# Patient Record
Sex: Male | Born: 1965 | Race: Black or African American | Hispanic: No | Marital: Single | Smoking: Current every day smoker
Health system: Southern US, Community
[De-identification: ages and names within clinical notes are randomized; demographics above are authoritative.]

## PROBLEM LIST (undated history)

## (undated) DIAGNOSIS — F209 Schizophrenia, unspecified: Secondary | ICD-10-CM

## (undated) DIAGNOSIS — E119 Type 2 diabetes mellitus without complications: Secondary | ICD-10-CM

## (undated) DIAGNOSIS — F419 Anxiety disorder, unspecified: Secondary | ICD-10-CM

## (undated) DIAGNOSIS — F319 Bipolar disorder, unspecified: Secondary | ICD-10-CM

## (undated) DIAGNOSIS — K219 Gastro-esophageal reflux disease without esophagitis: Secondary | ICD-10-CM

## (undated) DIAGNOSIS — E7800 Pure hypercholesterolemia, unspecified: Secondary | ICD-10-CM

## (undated) HISTORY — DX: Gastro-esophageal reflux disease without esophagitis: K21.9

## (undated) HISTORY — DX: Bipolar disorder, unspecified: F31.9

## (undated) HISTORY — PX: LACERATION REPAIR: SHX5168

## (undated) HISTORY — DX: Schizophrenia, unspecified: F20.9

## (undated) HISTORY — DX: Anxiety disorder, unspecified: F41.9

## (undated) HISTORY — DX: Type 2 diabetes mellitus without complications: E11.9

---

## 2017-07-29 DIAGNOSIS — F209 Schizophrenia, unspecified: Secondary | ICD-10-CM | POA: Insufficient documentation

## 2017-07-29 DIAGNOSIS — F71 Moderate intellectual disabilities: Secondary | ICD-10-CM | POA: Insufficient documentation

## 2017-10-01 ENCOUNTER — Ambulatory Visit: Payer: Self-pay | Admitting: Family Medicine

## 2017-10-02 ENCOUNTER — Other Ambulatory Visit (HOSPITAL_COMMUNITY)
Admission: RE | Admit: 2017-10-02 | Discharge: 2017-10-02 | Disposition: A | Discharge status: Home / Self Care | Payer: Medicare Other | Source: Ambulatory Visit | Attending: Family Medicine | Admitting: Family Medicine

## 2017-10-02 ENCOUNTER — Other Ambulatory Visit: Payer: Self-pay

## 2017-10-02 ENCOUNTER — Encounter (INDEPENDENT_AMBULATORY_CARE_PROVIDER_SITE_OTHER): Payer: Self-pay

## 2017-10-02 ENCOUNTER — Encounter: Payer: Self-pay | Admitting: Family Medicine

## 2017-10-02 ENCOUNTER — Ambulatory Visit (INDEPENDENT_AMBULATORY_CARE_PROVIDER_SITE_OTHER): Payer: Medicaid Other | Admitting: Family Medicine

## 2017-10-02 VITALS — BP 126/68 | HR 74 | Temp 98.3°F | Resp 16 | Ht 65.0 in | Wt 210.8 lb

## 2017-10-02 DIAGNOSIS — R945 Abnormal results of liver function studies: Secondary | ICD-10-CM

## 2017-10-02 DIAGNOSIS — Z23 Encounter for immunization: Secondary | ICD-10-CM

## 2017-10-02 DIAGNOSIS — E559 Vitamin D deficiency, unspecified: Secondary | ICD-10-CM

## 2017-10-02 DIAGNOSIS — K219 Gastro-esophageal reflux disease without esophagitis: Secondary | ICD-10-CM | POA: Diagnosis not present

## 2017-10-02 DIAGNOSIS — L853 Xerosis cutis: Secondary | ICD-10-CM

## 2017-10-02 DIAGNOSIS — Z720 Tobacco use: Secondary | ICD-10-CM | POA: Diagnosis not present

## 2017-10-02 DIAGNOSIS — Z87898 Personal history of other specified conditions: Secondary | ICD-10-CM

## 2017-10-02 DIAGNOSIS — R319 Hematuria, unspecified: Secondary | ICD-10-CM

## 2017-10-02 DIAGNOSIS — Z79899 Other long term (current) drug therapy: Secondary | ICD-10-CM | POA: Diagnosis not present

## 2017-10-02 DIAGNOSIS — D649 Anemia, unspecified: Secondary | ICD-10-CM

## 2017-10-02 DIAGNOSIS — R7989 Other specified abnormal findings of blood chemistry: Secondary | ICD-10-CM

## 2017-10-02 DIAGNOSIS — R51 Headache: Secondary | ICD-10-CM | POA: Diagnosis not present

## 2017-10-02 DIAGNOSIS — K5900 Constipation, unspecified: Secondary | ICD-10-CM

## 2017-10-02 DIAGNOSIS — J181 Lobar pneumonia, unspecified organism: Secondary | ICD-10-CM | POA: Diagnosis not present

## 2017-10-02 DIAGNOSIS — Z113 Encounter for screening for infections with a predominantly sexual mode of transmission: Secondary | ICD-10-CM

## 2017-10-02 DIAGNOSIS — F1011 Alcohol abuse, in remission: Secondary | ICD-10-CM

## 2017-10-02 DIAGNOSIS — R519 Headache, unspecified: Secondary | ICD-10-CM

## 2017-10-02 DIAGNOSIS — J189 Pneumonia, unspecified organism: Secondary | ICD-10-CM

## 2017-10-02 MED ORDER — OMEPRAZOLE 20 MG PO CPDR: 20.0000 mg | DELAYED_RELEASE_CAPSULE | Freq: Every day | ORAL | 1 refills | Status: DC

## 2017-10-02 MED ORDER — BLISTEX LIP BALM 2-2.5-6.6 % EX STCK: CUTANEOUS | 11 refills | Status: DC

## 2017-10-02 MED ORDER — DOCUSATE SODIUM 100 MG PO CAPS: 100.0000 mg | ORAL_CAPSULE | Freq: Every day | ORAL | 1 refills | Status: DC

## 2017-10-02 MED ORDER — CHOLECALCIFEROL 25 MCG (1000 UT) PO CAPS: 1000.0000 [IU] | ORAL_CAPSULE | Freq: Every day | ORAL | 1 refills | Status: DC

## 2017-10-02 MED ORDER — EUCERIN EX CREA: TOPICAL_CREAM | CUTANEOUS | 2 refills | Status: DC | PRN

## 2017-10-02 MED ORDER — IBUPROFEN 200 MG PO TABS: 400.0000 mg | ORAL_TABLET | Freq: Three times a day (TID) | ORAL | 0 refills | Status: DC | PRN

## 2017-10-02 NOTE — Progress Notes (Signed)
Patient ID: Richard Abbott, male    DOB: 20-Feb-1966, 51 y.o.   MRN: [id number]  Chief Complaint  Patient presents with  . Pneumonia  . Gastroesophageal Reflux    Allergies Patient has no known allergies.  Subjective:   Richard Abbott is a 51 y.o. male who presents to [facility] today.  HPI Mr. Richard Abbott presents today as a new patient visit to establish care.  Is accompanied today by Richard Abbott, who is the administrator at the group home where he currently resides.  Most of history today is given by her.  She reports that Mr. Richard Abbott moved into her group home in September after being in [facility].  She reports that he has been incarcerated for burglary and drug charges.  He has a prior history of alcohol and drug use which were believed to be secondary to his mental disorder.  Has been treated for schizophrenia and bipolar for the past several years.  [facility] Geanie CooleyLay.  Is on Haldol injections, valproic acid, and Cogentin.  Patient reports that [place] and most his family lives in [place].  He reports that one day he would like to go back to [place] to live.  He reports he is happy living in the group home now.  Reports he went to eighth grade in school.  He reports that he was in special classes.  Denies any trouble or problems with his mood.  Reports that he does smoke cigarettes about half a pack or third a pack per day.  Also smokes cigars from time to time.  Denies any alcohol or drug use.  Reports he has had a past history of gonorrheal infection.  Reports she has not been sexually active in greater than a year.  Reports his appetite is good.  Energy level is good.  Enjoys listening to music, watching television, playing pool.  Is brought in today to establish care.  Was recently seen at an urgent care clinic and reads full and treated for influenza and lung infection.  Was subsequently taken  to the emergency department and treated for pneumonia.  Labs and radiographic studies [facility] reviewed which revealed evidence of right lower lobe opacity, consistent with pneumonia.  In addition lab tests were significant for anemia, elevated liver function tests.  Urinalysis was significant for microscopic hematuria and proteinuria.  He also had some mild epigastric tenderness when he was seen at the emergency department.  A CT scan was performed which revealed gastritis and questionable enterocolitis.  Patient was not admitted to the hospital.  He was started on Avelox.  He has completed all the antibiotics.  He denies any cough, shortness of breath, or wheezing.  He reports that his bowel movements are back to normal.  Denies any nausea, vomiting, or diarrhea.  He tested negative for influenza at the hospital.  However he did complete his course of treatment of Tamiflu which was started at the urgent care clinic here in [place].  Patient feels like he is back to his normal self.  Patient reports he has never had any history of asthma, diabetes, or kidney problems.  He denies any history of hypertension.  Denies any history of seizures.  Reports he believes his mood is fine.  Denies any auditory or visual hallucinations.  Ms. Richard Abbott reports that patient is a ward of DSS and will likely continue to live  in this group home long-term.  She does have some medical records from [facility] which we will copy today.  She does not have any immunization report of his previous vaccinations.       Gastroesophageal Reflux  He reports no abdominal pain, no choking or no nausea. This is a chronic problem. The current episode started more than 1 year ago. The problem occurs occasionally. The problem has been waxing and waning. The symptoms are aggravated by certain foods. Pertinent negatives include no anemia, fatigue or melena. Risk factors include lack of exercise, obesity and  smoking/tobacco exposure. He has tried a PPI for the symptoms. The treatment provided significant relief. Patient reports that he has had a scope in the past and was told he had reflux.  This was while he was in [facility].  .    Past Medical History:  Diagnosis Date  . Anxiety   . Bipolar disorder (HCC)   . GERD (gastroesophageal reflux disease)   . Schizophrenia [facility])     Past Surgical History:  Procedure Laterality Date  . LACERATION REPAIR      History reviewed. No pertinent family history.   Social History   Socioeconomic History  . Marital status: Single    Spouse name: None  . Number of children: None  . Years of education: None  . Highest education level: None  Social Needs  . Financial resource strain: None  . Food insecurity - worry: None  . Food insecurity - inability: None  . Transportation needs - medical: None  . Transportation needs - non-medical: None  Occupational History  . None  Tobacco Use  . Smoking status: Current Every Day Smoker    Types: Cigarettes, Cigars  . Smokeless tobacco: Never Used  . Tobacco comment: Patient unsure of date where he began smoking but has been smoking for many years.  Substance and Sexual Activity  . Alcohol use: No    Frequency: Never    Comment: Prior history of alcohol abuse  . Drug use: No    Comment: Prior history of drug abuse  . Sexual activity: Not Currently    Partners: Male    Comment: Denies that he is currently sexually active  Other Topics Concern  . None  Social History Narrative   Grew up in  [place], [place].    Reports that he has a big family.    Has one brother and five sisters.   Father died. Mother living.  Does not have contact with his family.   Wants to move back to [place].      Moved in group home on 07/10/2017 from [facility] for 3 years.    Was in [place] for 3 years after being incarcerated for drugs/alcohol/burgularly.    DSS is Guardian.        Review of Systems    Constitutional: Negative for activity change, chills, diaphoresis, fatigue and unexpected weight change.  HENT: Negative for dental problem, sinus pain and voice change.   Eyes: Negative for photophobia and visual disturbance.  Respiratory: Negative for choking and chest tightness.   Cardiovascular: Negative for palpitations and leg swelling.  Gastrointestinal: Negative for abdominal pain, constipation, diarrhea, melena, nausea, rectal pain and vomiting.  Endocrine: Negative for polyphagia and polyuria.  Genitourinary: Negative for decreased urine volume, discharge, dysuria, frequency, genital sores, hematuria, penile pain and urgency.  Musculoskeletal: Negative for arthralgias, gait problem and joint swelling.  Skin: Negative for color change, pallor and rash.  Neurological: Negative  for dizziness, syncope and light-headedness.  Hematological: Negative for adenopathy. Does not bruise/bleed easily.  Psychiatric/Behavioral: Negative for dysphoric mood. The patient is not nervous/anxious.      Objective:   BP 126/68 (BP Location: Left Arm, Patient Position: Sitting, Cuff Size: Normal)   Pulse 74   Temp 98.3 F (36.8 C) (Temporal)   Resp 16   Ht 5\' 5"  (1.651 m)   Wt 210 lb 12 oz (95.6 kg)   SpO2 94%   BMI 35.07 kg/m   Physical Exam  Constitutional: He is oriented to person, place, and time. He appears well-developed and well-nourished.  Pleasant male, well-developed well-nourished.  Falls asleep multiple times during interview.  HENT:  Head: Normocephalic and atraumatic.  Nose: Nose normal.  Mouth/Throat: Oropharynx is clear and moist. No oropharyngeal exudate.  Eyes: Conjunctivae and EOM are normal. Pupils are equal, round, and reactive to light. No scleral icterus.  Neck: Normal range of motion. Neck supple. No JVD present. No tracheal deviation present. No thyromegaly present.  Cardiovascular: Normal rate, regular rhythm and normal heart sounds.  Pulmonary/Chest: Effort  normal and breath sounds normal. No stridor. No respiratory distress. He has no wheezes. He exhibits no tenderness.  Abdominal: Soft. Bowel sounds are normal. He exhibits no distension and no mass. There is no tenderness. There is no rebound.  Central obesity.  Musculoskeletal: Normal range of motion. He exhibits no edema.  Lymphadenopathy:    He has no cervical adenopathy.  Neurological: He is alert and oriented to person, place, and time. No cranial nerve deficit.  Skin: Skin is warm.  Mild xerosis of hands bilaterally.  Psychiatric: His affect is labile and inappropriate.  Well-developed well-nourished male.  Appearance younger than stated age.  Inappropriate and labile affect and mood.  Thought content without active obsessions, compulsions, or delusions.  Decreased intellectual functioning.  Suspect judgment somewhat impaired secondary to intellectual functioning.  No suicidal or homicidal ideations.  Patient denies hallucinations.  Insight poor.  No witnessed extraparidymal symptoms.   Vitals reviewed.    Assessment and Plan  1. Dry skin Robin/group home administrator request prescription for his lip balm and Eucerin cream which are used for dry lips and dry skin. - Sunscreens (BLISTEX LIP BALM) 2-2.5-6.6 % STCK; Apply to lips prn for chap/dry lips  Dispense: 1 each; Refill: 11 - Skin Protectants, Misc. (EUCERIN) cream; Apply topically as needed for dry skin.  Dispense: 454 g; Refill: 2  2. Gastroesophageal reflux disease, esophagitis presence not specified We will attempt to get records from his endoscopy.  Continue omeprazole at this time. - omeprazole (PRILOSEC) 20 MG capsule; Take 1 capsule (20 mg total) by mouth daily.  Dispense: 90 capsule; Refill: 1  3. Vitamin D deficiency Continue vitamin D supplementation.  Check labs.  Consider starting multivitamin. - Cholecalciferol (D3-1000) 1000 units capsule; Take 1 capsule (1,000 Units total) by mouth daily.  Dispense: 90 capsule;  Refill: 1  4. Constipation, unspecified constipation type Long history of constipation, relieved with Colace.  Reportedly due to his psychiatric medications.  Counseled concerning worrisome signs and symptoms.  Continue Colace at this time.  Encouraged to increase water intake. - docusate sodium (COLACE) 100 MG capsule; Take 1 capsule (100 mg total) by mouth daily.  Dispense: 90 capsule; Refill: 1  5. Nonintractable headache, unspecified chronicity pattern, unspecified headache type Group home administrator request prescription that he may have Advil or Tylenol if needed for minor headache or pain.  Grip was given that he could use over-the-counter  Advil 2 tablets every 8 hours as needed.  Did discuss with patient and administrator that would not recommend Tylenol at this time due to elevated liver tests from the emergency department. - ibuprofen (ADVIL) 200 MG tablet; Take 2 tablets (400 mg total) by mouth every 8 (eight) hours as needed for headache or mild pain.  Dispense: 90 tablet; Refill: 0  6. Anemia, unspecified type Repeat labs today.  If patient is in fact anemic will need further workup. - CBC with Differential/Platelet  7. Elevated liver function tests Elevated liver tests on review of emergency department notes.  Questionable related to pneumonia/viral syndrome.  Patient with high risk history including prior tobacco, alcohol, drug use and incarceration.  We will check for hepatitis at this time.  Would recommend vaccination for hepatitis a and B if testing is negative.  However will also wait for immunization record. - Hepatitis panel, acute  8. Screen for STD (sexually transmitted disease) Patient denies any sexual activity within the past year.  However patient is high risk.  Will check for HIV syphilis and urine testing for sexually transmitted diseases. - HIV antibody - RPR - Urine cytology ancillary only  9. High risk medication use  - Basic metabolic panel  10.  Hematuria, unspecified type Recheck urinalysis today secondary to microscopic hematuria and proteinuria on urinalysis from the emergency department. - Urinalysis - Urine cytology ancillary only  11. Pneumonia of right lower lobe due to infectious organism [facility](HCC) Examination within normal limits today.  Chest x-ray reviewed revealing opacification in right lung fields.  Discussed with patient and caregiver that we will need to repeat chest x-ray and 6-8 weeks to ensure resolution and normalcy of chest x-ray.  12. Tobacco abuse The 5 A's Model for treating Tobacco Use and Dependence was used today. I have identified and documented tobacco use status for this patient. I have urged the patient to quit tobacco use. At this time, the patient is unwilling and not ready to attempt to quit. I have provided patient with information regarding risks, cessation techniques, and interventions that might increase future attempts to quit smoking. I will plan on again addressing tobacco dependence at the next visit.   13. History of alcohol abuse Currently in remission, living in group home.  14. Need for immunization against influenza Immun[facility]. - Flu Vaccine QUAD 36+ mos IM   Prescriptions were sent in today [facility].  Hard copy prescription was given for Eucerin and Blistex lip balm.  Medication orders from group home were reviewed.  Acetaminophen, Tussen DM, and pro-air were discontinued. OV was greater than 45 hour and over 50% of the office visit was spent counseling and coordinating care.  Return in about 2 months (around 12/03/2017) for follow up. Aliene Beamsachel Meldrick Buttery, MD 10/02/2017

## 2017-10-03 ENCOUNTER — Telehealth: Payer: Self-pay | Admitting: Family Medicine

## 2017-10-03 ENCOUNTER — Other Ambulatory Visit (HOSPITAL_COMMUNITY)
Admission: RE | Admit: 2017-10-03 | Discharge: 2017-10-03 | Disposition: A | Discharge status: Home / Self Care | Payer: Medicare Other | Source: Other Acute Inpatient Hospital | Attending: Family Medicine | Admitting: Family Medicine

## 2017-10-03 DIAGNOSIS — R319 Hematuria, unspecified: Secondary | ICD-10-CM | POA: Insufficient documentation

## 2017-10-03 DIAGNOSIS — R8290 Unspecified abnormal findings in urine: Secondary | ICD-10-CM

## 2017-10-03 LAB — BASIC METABOLIC PANEL
BUN: 21 mg/dL (ref 7–25)
CALCIUM: 9.1 mg/dL (ref 8.6–10.3)
CO2: 27 mmol/L (ref 20–32)
Chloride: 105 mmol/L (ref 98–110)
Creat: 0.79 mg/dL (ref 0.60–1.35)
GLUCOSE: 90 mg/dL (ref 65–139)
Potassium: 4.9 mmol/L (ref 3.5–5.3)
Sodium: 140 mmol/L (ref 135–146)

## 2017-10-03 LAB — CBC WITH DIFFERENTIAL/PLATELET
BASOS ABS: 17 {cells}/uL (ref 0–200)
BASOS PCT: 0.3 %
EOS PCT: 1.2 %
Eosinophils Absolute: 68 cells/uL (ref 15–500)
HCT: 35.3 % — ABNORMAL LOW (ref 38.5–50.0)
Hemoglobin: 11.8 g/dL — ABNORMAL LOW (ref 13.2–17.1)
Lymphs Abs: 1647 cells/uL (ref 850–3900)
MCH: 29.2 pg (ref 27.0–33.0)
MCHC: 33.4 g/dL (ref 32.0–36.0)
MCV: 87.4 fL (ref 80.0–100.0)
MONOS PCT: 7.3 %
MPV: 10.3 fL (ref 7.5–12.5)
NEUTROS ABS: 3551 {cells}/uL (ref 1500–7800)
Neutrophils Relative %: 62.3 %
PLATELETS: 521 10*3/uL — AB (ref 140–400)
RBC: 4.04 10*6/uL — AB (ref 4.20–5.80)
RDW: 14.6 % (ref 11.0–15.0)
Total Lymphocyte: 28.9 %
WBC mixed population: 416 cells/uL (ref 200–950)
WBC: 5.7 10*3/uL (ref 3.8–10.8)

## 2017-10-03 LAB — HEPATITIS PANEL, ACUTE
HEP A IGM: NONREACTIVE
HEP B S AG: NONREACTIVE
Hep B C IgM: NONREACTIVE
Hepatitis C Ab: NONREACTIVE
SIGNAL TO CUT-OFF: 0.32 (ref <1.00)

## 2017-10-03 LAB — HIV ANTIBODY (ROUTINE TESTING W REFLEX): HIV 1&2 Ab, 4th Generation: NONREACTIVE

## 2017-10-03 LAB — URINALYSIS, COMPLETE (UACMP) WITH MICROSCOPIC
Bilirubin Urine: NEGATIVE
Glucose, UA: NEGATIVE mg/dL
Hgb urine dipstick: NEGATIVE
Ketones, ur: NEGATIVE mg/dL
Leukocytes, UA: NEGATIVE
Nitrite: NEGATIVE
PH: 6 (ref 5.0–8.0)
Protein, ur: NEGATIVE mg/dL
RBC / HPF: NONE SEEN RBC/hpf (ref 0–5)
Squamous Epithelial / LPF: NONE SEEN

## 2017-10-03 LAB — URINE CYTOLOGY ANCILLARY ONLY
CHLAMYDIA, DNA PROBE: NEGATIVE
NEISSERIA GONORRHEA: NEGATIVE
Trichomonas: NEGATIVE

## 2017-10-03 LAB — RPR: RPR: NONREACTIVE

## 2017-10-03 NOTE — Telephone Encounter (Signed)
-----   Message from Aliene Beamsachel Hagler, MD sent at 10/03/2017 12:42 PM EST ----- Please see if they can culture his urine. Janine Limboachel H. Tracie HarrierHagler, MD

## 2017-10-05 LAB — URINE CULTURE: Culture: NO GROWTH

## 2017-11-11 ENCOUNTER — Telehealth: Payer: Self-pay | Admitting: Family Medicine

## 2017-11-11 NOTE — Telephone Encounter (Signed)
Patient will need to be seen for evaluation of symptoms. Please schedule.

## 2017-11-11 NOTE — Telephone Encounter (Signed)
Tira w\group home is calling in stating that the patient has a Sinus Infection, can you call him in something ..[phone]

## 2017-11-12 ENCOUNTER — Ambulatory Visit: Payer: Medicare Other | Admitting: Family Medicine

## 2017-11-14 ENCOUNTER — Ambulatory Visit (INDEPENDENT_AMBULATORY_CARE_PROVIDER_SITE_OTHER): Payer: Medicaid Other | Admitting: Family Medicine

## 2017-11-14 ENCOUNTER — Ambulatory Visit (HOSPITAL_COMMUNITY)
Admission: RE | Admit: 2017-11-14 | Discharge: 2017-11-14 | Disposition: A | Discharge status: Home / Self Care | Payer: Medicare Other | Source: Ambulatory Visit | Attending: Family Medicine | Admitting: Family Medicine

## 2017-11-14 ENCOUNTER — Other Ambulatory Visit: Payer: Self-pay

## 2017-11-14 ENCOUNTER — Encounter: Payer: Self-pay | Admitting: Family Medicine

## 2017-11-14 VITALS — BP 120/70 | HR 83 | Temp 98.6°F | Resp 16 | Ht 65.0 in | Wt 224.8 lb

## 2017-11-14 DIAGNOSIS — R635 Abnormal weight gain: Secondary | ICD-10-CM

## 2017-11-14 DIAGNOSIS — Z8701 Personal history of pneumonia (recurrent): Secondary | ICD-10-CM

## 2017-11-14 DIAGNOSIS — F209 Schizophrenia, unspecified: Secondary | ICD-10-CM | POA: Diagnosis not present

## 2017-11-14 DIAGNOSIS — R918 Other nonspecific abnormal finding of lung field: Secondary | ICD-10-CM | POA: Insufficient documentation

## 2017-11-14 DIAGNOSIS — Z79899 Other long term (current) drug therapy: Secondary | ICD-10-CM | POA: Diagnosis not present

## 2017-11-14 DIAGNOSIS — F09 Unspecified mental disorder due to known physiological condition: Secondary | ICD-10-CM | POA: Diagnosis not present

## 2017-11-14 DIAGNOSIS — D508 Other iron deficiency anemias: Secondary | ICD-10-CM

## 2017-11-14 DIAGNOSIS — K5900 Constipation, unspecified: Secondary | ICD-10-CM

## 2017-11-14 NOTE — Progress Notes (Signed)
Patient ID: Richard Abbott, male    DOB: [birth year], 52 y.o.   MRN: [id number]  Chief Complaint  Patient presents with  . Follow-up    Allergies Patient has no known allergies.  Subjective:   Richard Abbott is a 52 y.o. male who presents to [facility] today.  HPI Richard Abbott presents today with 1 of the nursing assistants from his group home.  He presents today for follow-up appointment.  Reportedly when the appointment was made he was having some sinus pressure and nasal congestion for 2 days.  However he has been using some saline nasal spray and his symptoms are cleared.  He is having no facial pain, sinus congestion, nasal congestion, nasal discharge, or headaches.  [name], the nursing assistant reports that it was initially thought that he had a sinus infection but he is gotten better.  He was supposed to come in for a follow-up for his lab work anyway.  He was seen initially in our office and review of his labs from his emergency department visit revealed anemia.  Subsequently a CBC was checked in our office which revealed anemia.  He reports that he does deal with constipation and this has been an ongoing problem.  It is been thought that his issues with constipation were secondary to his psychiatric medications.  He denies any melena, hematochezia, or bright red blood per rectum.  He denies any abdominal pain.  He is followed by a psychiatrist for his schizophrenia and mood disorder.  We have not received any records from [facility] at this time.  He has gained greater than 5 pounds since his last visit.  He reports that he has a good appetite and eats a lot.  He reports that he would like to gain weight and get bigger and stronger.    Past Medical History:  Diagnosis Date  . Anxiety   . Bipolar disorder (HCC)   . GERD (gastroesophageal reflux disease)   . Schizophrenia [facility])     Past Surgical History:  Procedure Laterality Date  . LACERATION REPAIR      No  family history on file.   Social History   Socioeconomic History  . Marital status: Single    Spouse name: None  . Number of children: None  . Years of education: None  . Highest education level: None  Social Needs  . Financial resource strain: None  . Food insecurity - worry: None  . Food insecurity - inability: None  . Transportation needs - medical: None  . Transportation needs - non-medical: None  Occupational History  . None  Tobacco Use  . Smoking status: Current Every Day Smoker    Types: Cigarettes, Cigars  . Smokeless tobacco: Never Used  . Tobacco comment: Patient unsure of date where he began smoking but has been smoking for many years.  Substance and Sexual Activity  . Alcohol use: No    Frequency: Never    Comment: Prior history of alcohol abuse  . Drug use: No    Comment: Prior history of drug abuse  . Sexual activity: Not Currently    Partners: Male    Comment: Denies that he is currently sexually active  Other Topics Concern  . None  Social History Narrative   Grew up in  [place], [place].    Reports that he has a big family.    Has one brother and five sisters.   Father died. Mother living.  Does not have contact with his  family.   Wants to move back to [place].      Moved in group home on 07/10/2017 from [facility] for 3 years.    Was in [place] for 3 years after being incarcerated for drugs/alcohol/burgularly.    DSS is Guardian.        Review of Systems  Constitutional: Negative for activity change, appetite change, chills, fatigue and fever.  HENT: Negative for congestion, drooling, ear discharge, nosebleeds, postnasal drip, rhinorrhea, sinus pressure, sinus pain, sneezing, trouble swallowing and voice change.   Eyes: Negative for visual disturbance.  Respiratory: Negative for chest tightness, shortness of breath, wheezing and stridor.   Gastrointestinal: Negative for abdominal pain, blood in stool, constipation, diarrhea, nausea, rectal  pain and vomiting.  Genitourinary: Negative for dysuria, frequency, hematuria and urgency.  Musculoskeletal: Negative for back pain, gait problem, joint swelling and neck stiffness.  Skin: Negative for rash.  Neurological: Negative for dizziness, tremors, syncope, weakness, light-headedness, numbness and headaches.  Hematological: Negative for adenopathy. Does not bruise/bleed easily.  Psychiatric/Behavioral: Negative for decreased concentration and suicidal ideas. The patient is not nervous/anxious.      Objective:   BP 120/70 (BP Location: Left Arm, Patient Position: Sitting, Cuff Size: Normal)   Pulse 83   Temp 98.6 F (37 C) (Temporal)   Resp 16   Ht 5\' 5"  (1.651 m)   Wt 224 lb 12 oz (101.9 kg)   SpO2 96%   BMI 37.40 kg/m   Physical Exam  Constitutional: He is oriented to person, place, and time. He appears well-developed and well-nourished.  HENT:  Head: Normocephalic and atraumatic.  Right Ear: External ear normal.  Eyes: EOM are normal. Pupils are equal, round, and reactive to light.  Neck: Normal range of motion. Neck supple.  Cardiovascular: Normal rate and regular rhythm.  Pulmonary/Chest: Effort normal and breath sounds normal.  Musculoskeletal: Normal range of motion.  Neurological: He is alert and oriented to person, place, and time.  Skin: Skin is warm and dry.  Psychiatric: His affect is labile and inappropriate. Cognition and memory are normal. He expresses inappropriate judgment.  Patient's mood and affect is not much change from his last visit.  He has obvious mood disorder and affect is labile and at times and appropriate.  He blurts out words and phrases during visit which are inappropriate.  His behavior is not combative or threatening.  His thought processes are not goal-directed or completely logical.  I do believe that his cognitive function is below normal.  Vitals reviewed.    Assessment and Plan   1. Constipation, unspecified constipation  type Due to history of weight gain and long history of constipation and the fact the patient is a poor historian, will screen for thyroid dysfunction at this time. - TSH  2. Other iron deficiency anemia Discussed with nursing assistant today that due to the fact that he has iron deficient anemia that he needs to be seen and evaluated by gastroenterology for a colonoscopy.  Referral placed today - CBC with Differential/Platelet - Fe+TIBC+Fer - Ambulatory referral to Gastroenterology Patient asked multiple questions regarding what a colonoscopy was.  I explained the colonoscopy procedure and the need for it.  He found this very funny and left for an extended period of time.  3. History of pneumonia Patient has completed treatment for pneumonia and his cough has resolved.  We will repeat chest x-ray today to check for resolution of opacities. - DG Chest 2 View; Future  4. Weight gain Also  be secondary to diet versus psychiatric medications.  Screen for diabetes since his psychotic medications are known to cause hyperglycemia and weight gain. - Hemoglobin A1c  5. High risk medication use  At the close of the office visit I was given forms which needed to be completed for personal care services for this patient.  The copies that I was given were of such poor quality that I could not read the type on the page.  I have asked the nursing assistant to please supply me with copies which are readable.  In addition I requested information regarding what personal care services entails.  I also relayed the fact that his need for personal care services is due to his mood disorder and decreased cognitive function.  I do believe he needs personal care services but need more information about the services that the group home is requesting.  In addition they may need to get approval for services from his psychiatrist who manages his psychiatric disorders.    No Follow-up on file. Aliene Beamsachel Umar Patmon,  MD 11/14/2017

## 2017-11-15 LAB — URINALYSIS
Bilirubin Urine: NEGATIVE
GLUCOSE, UA: NEGATIVE
HGB URINE DIPSTICK: NEGATIVE
Ketones, ur: NEGATIVE
LEUKOCYTES UA: NEGATIVE
NITRITE: NEGATIVE
PROTEIN: NEGATIVE
Specific Gravity, Urine: 1.029 (ref 1.001–1.03)
pH: 7 (ref 5.0–8.0)

## 2017-11-15 LAB — CBC WITH DIFFERENTIAL/PLATELET
Basophils Absolute: 30 cells/uL (ref 0–200)
Basophils Relative: 0.8 %
Eosinophils Absolute: 110 cells/uL (ref 15–500)
Eosinophils Relative: 2.9 %
HCT: 37.2 % — ABNORMAL LOW (ref 38.5–50.0)
Hemoglobin: 12.3 g/dL — ABNORMAL LOW (ref 13.2–17.1)
Lymphs Abs: 1360 cells/uL (ref 850–3900)
MCH: 28.7 pg (ref 27.0–33.0)
MCHC: 33.1 g/dL (ref 32.0–36.0)
MCV: 86.9 fL (ref 80.0–100.0)
MPV: 11.8 fL (ref 7.5–12.5)
Monocytes Relative: 13.6 %
Neutro Abs: 1782 cells/uL (ref 1500–7800)
Neutrophils Relative %: 46.9 %
Platelets: 328 10*3/uL (ref 140–400)
RBC: 4.28 10*6/uL (ref 4.20–5.80)
RDW: 14.9 % (ref 11.0–15.0)
Total Lymphocyte: 35.8 %
WBC mixed population: 517 cells/uL (ref 200–950)
WBC: 3.8 10*3/uL (ref 3.8–10.8)

## 2017-11-15 LAB — HEMOGLOBIN A1C
Hgb A1c MFr Bld: 6.5 % of total Hgb — ABNORMAL HIGH (ref <5.7)
Mean Plasma Glucose: 140 (calc)
eAG (mmol/L): 7.7 (calc)

## 2017-11-15 LAB — IRON,TIBC AND FERRITIN PANEL
%SAT: 22 % (calc) (ref 15–60)
Ferritin: 32 ng/mL (ref 20–380)
Iron: 81 ug/dL (ref 50–180)
TIBC: 367 mcg/dL (calc) (ref 250–425)

## 2017-11-15 LAB — TSH: TSH: 1.39 mIU/L (ref 0.40–4.50)

## 2017-11-19 ENCOUNTER — Telehealth: Payer: Self-pay | Admitting: Family Medicine

## 2017-11-19 NOTE — Telephone Encounter (Signed)
Brandt Loosenobin Williams - administrator at community home, is requesting a call back regarding patient (she left a vm)  [phone]

## 2017-11-19 NOTE — Telephone Encounter (Signed)
Spoke to [facility], appoint set up for paperwork and diabetes.

## 2017-11-26 ENCOUNTER — Encounter: Payer: Self-pay | Admitting: Gastroenterology

## 2017-12-03 ENCOUNTER — Telehealth: Payer: Self-pay | Admitting: Family Medicine

## 2017-12-03 NOTE — Telephone Encounter (Signed)
Please schedule him.

## 2017-12-03 NOTE — Telephone Encounter (Signed)
There are no openings for the rest of this week. Can I put him in a blocked spot thurs or does he need to possibly see Dr.Nelson tomorrow ? Tyra is calling me back after she asks her administrator if this is ok.

## 2017-12-03 NOTE — Telephone Encounter (Signed)
Needs to come in for OV. I need to listen to his lungs and evaluate patient. Cannot do that over the phone. Janine Limboachel H. Tracie HarrierHagler, MD

## 2017-12-03 NOTE — Telephone Encounter (Signed)
Tyra Hankins-caregiver Cb#: [phone]  Symptoms: chest congestion, nasal congestion, says he feels dizzy when he coughs, no fever  Please call to discuss, he has had an pneumonia before  So they are trying to avoid this happening again.

## 2017-12-03 NOTE — Telephone Encounter (Signed)
Please advise 

## 2017-12-03 NOTE — Telephone Encounter (Signed)
Or call Tyra at [phone]

## 2017-12-04 ENCOUNTER — Ambulatory Visit: Payer: Medicare Other

## 2017-12-04 ENCOUNTER — Ambulatory Visit: Payer: Medicare Other | Admitting: Family Medicine

## 2017-12-04 VITALS — BP 120/76 | HR 72 | Temp 97.8°F | Resp 18

## 2017-12-04 DIAGNOSIS — R0989 Other specified symptoms and signs involving the circulatory and respiratory systems: Secondary | ICD-10-CM

## 2017-12-04 NOTE — Telephone Encounter (Signed)
appt scheduled

## 2017-12-05 ENCOUNTER — Ambulatory Visit (INDEPENDENT_AMBULATORY_CARE_PROVIDER_SITE_OTHER): Payer: Medicaid Other | Admitting: Family Medicine

## 2017-12-05 ENCOUNTER — Other Ambulatory Visit: Payer: Self-pay

## 2017-12-05 ENCOUNTER — Encounter: Payer: Self-pay | Admitting: Family Medicine

## 2017-12-05 VITALS — BP 120/78 | HR 91 | Temp 97.8°F | Resp 16 | Ht 65.0 in | Wt 221.8 lb

## 2017-12-05 DIAGNOSIS — D649 Anemia, unspecified: Secondary | ICD-10-CM

## 2017-12-05 DIAGNOSIS — J40 Bronchitis, not specified as acute or chronic: Secondary | ICD-10-CM | POA: Insufficient documentation

## 2017-12-05 DIAGNOSIS — E119 Type 2 diabetes mellitus without complications: Secondary | ICD-10-CM

## 2017-12-05 LAB — LIPID PANEL
Cholesterol: 133 mg/dL (ref <200)
HDL: 26 mg/dL — AB (ref >40)
LDL Cholesterol (Calc): 80 mg/dL (calc)
NON-HDL CHOLESTEROL (CALC): 107 mg/dL (ref <130)
Total CHOL/HDL Ratio: 5.1 (calc) — ABNORMAL HIGH (ref <5.0)
Triglycerides: 167 mg/dL — ABNORMAL HIGH (ref <150)

## 2017-12-05 MED ORDER — AMOXICILLIN-POT CLAVULANATE 875-125 MG PO TABS: 1.0000 | ORAL_TABLET | Freq: Two times a day (BID) | ORAL | 0 refills | Status: DC

## 2017-12-05 MED ORDER — GUAIFENESIN 100 MG/5ML PO LIQD: 200.0000 mg | Freq: Three times a day (TID) | ORAL | 0 refills | Status: DC | PRN

## 2017-12-05 NOTE — Patient Instructions (Addendum)
PLEASE send IMMUNIZATION RECORDS Schedule appointment with GI for evaluation/colonoscopy/due to anemia    Steps to Quit Smoking Smoking tobacco can be bad for your health. It can also affect almost every organ in your body. Smoking puts you and people around you at risk for many serious long-lasting (chronic) diseases. Quitting smoking is hard, but it is one of the best things that you can do for your health. It is never too late to quit. What are the benefits of quitting smoking? When you quit smoking, you lower your risk for getting serious diseases and conditions. They can include:  Lung cancer or lung disease.  Heart disease.  Stroke.  Heart attack.  Not being able to have children (infertility).  Weak bones (osteoporosis) and broken bones (fractures).  If you have coughing, wheezing, and shortness of breath, those symptoms may get better when you quit. You may also get sick less often. If you are pregnant, quitting smoking can help to lower your chances of having a baby of low birth weight. What can I do to help me quit smoking? Talk with your doctor about what can help you quit smoking. Some things you can do (strategies) include:  Quitting smoking totally, instead of slowly cutting back how much you smoke over a period of time.  Going to in-person counseling. You are more likely to quit if you go to many counseling sessions.  Using resources and support systems, such as: ? Agricultural engineernline chats with a Veterinary surgeoncounselor. ? Phone quitlines. ? Automotive engineerrinted self-help materials. ? Support groups or group counseling. ? Text messaging programs. ? Mobile phone apps or applications.  Taking medicines. Some of these medicines may have nicotine in them. If you are pregnant or breastfeeding, do not take any medicines to quit smoking unless your doctor says it is okay. Talk with your doctor about counseling or other things that can help you.  Talk with your doctor about using more than one strategy at  the same time, such as taking medicines while you are also going to in-person counseling. This can help make quitting easier. What things can I do to make it easier to quit? Quitting smoking might feel very hard at first, but there is a lot that you can do to make it easier. Take these steps:  Talk to your family and friends. Ask them to support and encourage you.  Call phone quitlines, reach out to support groups, or work with a Veterinary surgeoncounselor.  Ask people who smoke to not smoke around you.  Avoid places that make you want (trigger) to smoke, such as: ? Bars. ? Parties. ? Smoke-break areas at work.  Spend time with people who do not smoke.  Lower the stress in your life. Stress can make you want to smoke. Try these things to help your stress: ? Getting regular exercise. ? Deep-breathing exercises. ? Yoga. ? Meditating. ? Doing a body scan. To do this, close your eyes, focus on one area of your body at a time from head to toe, and notice which parts of your body are tense. Try to relax the muscles in those areas.  Download or buy apps on your mobile phone or tablet that can help you stick to your quit plan. There are many free apps, such as QuitGuide from the [employer] developer(Centers for Disease Control and Prevention). You can find more support from smokefree.gov and other websites.  This information is not intended to replace advice given to you by your health care provider. Make sure  you discuss any questions you have with your health care provider. Document Released: 08/11/2009 Document Revised: 06/12/2016 Document Reviewed: 03/01/2015 Elsevier Interactive Patient Education  2018 [employer].

## 2017-12-05 NOTE — Progress Notes (Signed)
Patient ID: Richard Abbott, male    DOB: [birth year], 52 y.o.   MRN: [id number]  Chief Complaint  Patient presents with  . Cough  . Nasal Congestion    Allergies Patient has no known allergies.  Subjective:   Richard Abbott is a 52 y.o. male who presents to [facility] today.  HPI [name] presents with a 3 week history of cough, productive of sputum/yellow.. No fevers. Has had one episode of post-tussive emesis. Did take an Alka-seltzer and nasal spray and it helped a small amount. Has used some robitussin for the cough. Denies any SOB,  DOE, CP, or sore throat.  He does not have a history of asthma.  Does have a history of pneumonia.  He does report continued sinus pressure and congestion since he was last seen here several weeks ago.  Had labs done at his last visit which revealed a new diagnosis of diabetes mellitus type 2.  He is not currently on a diabetic diet.  His hemoglobin A1c was 6.5%.  He does have a strong family history of diabetes.  He reports that he does not believe this lab test and believes that the results are false.  He reports he has been getting some exercise by walking and lifting weights.  He denies any prior history of elevated blood sugars.  He denies any history of high blood pressure.  He is accompanied by a worker from his group home who reports that they are able to do a diabetic diet at the group home.  Demetrius still smokes approximately 4 cigarettes a day.  He is accompanied today by [name], worker at the group home where he lives.  I asked if he has got an appointment with the GI for evaluation of his anemia.  He is also in need of a screening colonoscopy.  She reports she was unsure.    Past Medical History:  Diagnosis Date  . Anxiety   . Bipolar disorder (HCC)   . GERD (gastroesophageal reflux disease)   . Schizophrenia [facility])     Past Surgical History:  Procedure Laterality Date  . LACERATION REPAIR      No family history on file.    Social History   Socioeconomic History  . Marital status: Single    Spouse name: None  . Number of children: None  . Years of education: None  . Highest education level: None  Social Needs  . Financial resource strain: None  . Food insecurity - worry: None  . Food insecurity - inability: None  . Transportation needs - medical: None  . Transportation needs - non-medical: None  Occupational History  . None  Tobacco Use  . Smoking status: Current Every Day Smoker    Types: Cigarettes, Cigars  . Smokeless tobacco: Never Used  . Tobacco comment: Patient unsure of date where he began smoking but has been smoking for many years.  Substance and Sexual Activity  . Alcohol use: No    Frequency: Never    Comment: Prior history of alcohol abuse  . Drug use: No    Comment: Prior history of drug abuse  . Sexual activity: Not Currently    Partners: Male    Comment: Denies that he is currently sexually active  Other Topics Concern  . None  Social History Narrative   Grew up in  [place], [place].    Reports that he has a big family.    Has one brother and five sisters.  Father died. Mother living.  Does not have contact with his family.   Wants to move back to [place].      Moved in group home on 07/10/2017 from [facility] for 3 years.    Was in [place] for 3 years after being incarcerated for drugs/alcohol/burgularly.    DSS is Guardian.        Review of Systems  HENT: Positive for congestion and sinus pressure. Negative for facial swelling, mouth sores, nosebleeds, postnasal drip, trouble swallowing and voice change.   Respiratory: Positive for cough. Negative for shortness of breath, wheezing and stridor.   Gastrointestinal: Negative for abdominal pain, blood in stool, constipation, diarrhea and nausea.     Objective:   BP 120/78 (BP Location: Left Arm, Patient Position: Sitting, Cuff Size: Normal)   Pulse 91   Temp 97.8 F (36.6 C) (Temporal)   Resp 16   Ht 5'  5" (1.651 m)   Wt 221 lb 12 oz (100.6 kg)   SpO2 95%   BMI 36.90 kg/m   Physical Exam  Constitutional: He is oriented to person, place, and time. He appears well-developed and well-nourished.  HENT:  Head: Normocephalic and atraumatic.  Right Ear: External ear normal.  Left Ear: External ear normal.  Nose: Nose normal.  Mouth/Throat: Oropharynx is clear and moist. No oropharyngeal exudate.  Eyes: Conjunctivae and EOM are normal. Pupils are equal, round, and reactive to light. No scleral icterus.  Neck: Normal range of motion. Neck supple. No JVD present. No tracheal deviation present. No thyromegaly present.  Cardiovascular: Normal rate, regular rhythm and normal heart sounds.  Pulmonary/Chest: Effort normal and breath sounds normal. No respiratory distress.  Abdominal: Soft.  Lymphadenopathy:    He has no cervical adenopathy.  Neurological: He is alert and oriented to person, place, and time. No cranial nerve deficit.  Skin: Skin is warm and dry. No rash noted.  Vitals reviewed.    Assessment and Plan  1. Type 2 diabetes mellitus without complication, without long-term current use of insulin (HCC) New diagnosis in patient with psychological and cognitive deficits, currently lives in a group home.  Discussed diabetes with him today although he is currently in a state of denial. - Ambulatory referral to Ophthalmology - Lipid Profile - Diet Carb Modified - Amb ref to Medical Nutrition Therapy-MNT -Foot exam at follow-up and vaccinations at follow-up on patient is not sick. -Diet, exercise, and weight loss recommended.  Will obtain urine microalbumin and other labs at follow-up. 2. Bronchitis Diabetic test and as needed over-the-counter.  Treat with antibiotics at this time.  And treatment of bronchitis.  Told to call with any questions, concerns, worsening symptoms or no resolution.  He is to follow-up in 2 months. - amoxicillin-clavulanate (AUGMENTIN) 875-125 MG tablet; Take 1  tablet by mouth 2 (two) times daily.  Dispense: 20 tablet; Refill: 0 - guaiFENesin (DIABETIC TUSSIN EX) 100 MG/5ML liquid; Take 10 mLs (200 mg total) by mouth 3 (three) times daily as needed for cough.  Dispense: 120 mL; Refill: 0   Group home worker who was present at visit was given a copy of the referral that was placed for gastroenterology.  I asked him to please call and make sure he does have an appointment.  I explained again that this was recommended secondary to his anemia and the fact that he also is in need of a screening colonoscopy.  She voiced understanding. Chose Augmentin secondary to good sinus penetration OV was 25 min.  Greater  than 50% of office visit spent counseling and coordinating care. Return in about 2 months (around 02/02/2018). Aliene Beamsachel Zahra Peffley, MD 12/05/2017

## 2017-12-06 ENCOUNTER — Telehealth: Payer: Self-pay | Admitting: Family Medicine

## 2017-12-06 NOTE — Telephone Encounter (Signed)
Insurance ID? I'm not sure what they are referring to

## 2017-12-06 NOTE — Telephone Encounter (Signed)
They left a voicemail requesting a note back from nurse.

## 2017-12-06 NOTE — Telephone Encounter (Signed)
Richard [facility] from [facility] [phone] needs ID # for patient

## 2017-12-09 NOTE — Telephone Encounter (Signed)
Spoke to La MesillaKeith, Harriett SineNancy, and someone [facility], they do need need anything from us.

## 2017-12-11 ENCOUNTER — Ambulatory Visit: Payer: Medicare Other | Admitting: Family Medicine

## 2017-12-12 ENCOUNTER — Other Ambulatory Visit: Payer: Self-pay | Admitting: Family Medicine

## 2017-12-12 DIAGNOSIS — R519 Headache, unspecified: Secondary | ICD-10-CM

## 2017-12-12 DIAGNOSIS — J40 Bronchitis, not specified as acute or chronic: Secondary | ICD-10-CM

## 2017-12-12 DIAGNOSIS — R51 Headache: Secondary | ICD-10-CM

## 2018-01-09 ENCOUNTER — Encounter: Payer: Medicare Other | Attending: Family Medicine | Admitting: Nutrition

## 2018-01-09 ENCOUNTER — Encounter: Payer: Self-pay | Admitting: Nutrition

## 2018-01-09 VITALS — Ht 65.0 in | Wt 226.0 lb

## 2018-01-09 DIAGNOSIS — E119 Type 2 diabetes mellitus without complications: Secondary | ICD-10-CM | POA: Diagnosis not present

## 2018-01-09 DIAGNOSIS — Z713 Dietary counseling and surveillance: Secondary | ICD-10-CM | POA: Insufficient documentation

## 2018-01-09 DIAGNOSIS — E118 Type 2 diabetes mellitus with unspecified complications: Secondary | ICD-10-CM

## 2018-01-09 DIAGNOSIS — E669 Obesity, unspecified: Secondary | ICD-10-CM

## 2018-01-09 DIAGNOSIS — IMO0002 Reserved for concepts with insufficient information to code with codable children: Secondary | ICD-10-CM

## 2018-01-09 DIAGNOSIS — E1165 Type 2 diabetes mellitus with hyperglycemia: Secondary | ICD-10-CM

## 2018-01-09 NOTE — Patient Instructions (Addendum)
Goals 1 Follow My Plate 2. Cut out sweet tea, soda and juices 3. Drink only water or unsweet tea. 4. Limit snacks to fruit or vegetables 5. Increase walking to 30 minutes daily. 6. Lose 4 lbs in the month Choose unsweetened cereal

## 2018-01-09 NOTE — Progress Notes (Signed)
Diabetes Self-Management Education  Visit Type: First/Initial  Appt. Start Time: 800 Appt. End Time: 0900  01/09/2018  Mr. Shela NevinArcher Oppenheimer, identified by name and date of birth, is a 52 y.o. male with a diagnosis of Diabetes: Type 2. He is here with Tyra, staff member from the Group Home. He eats 3 meals per day and snacks sometimes.  Diet is high in concentrated sweets and junk food. He is willing to work on making changes with his diet to improve his blood sugars and health and lose weight. Just got diagnosed with DM Type 30 Nov 2017.  ASSESSMENT  Height 5\' 5"  (1.651 m), weight 226 lb (102.5 kg). Body mass index is 37.61 kg/m. Lab Results  Component Value Date   HGBA1C 6.5 (H) 11/14/2017   CMP Latest Ref Rng & Units 10/02/2017  Glucose 65 - 139 mg/dL 90  BUN 7 - 25 mg/dL 21  Creatinine 1.610.60 - 0.961.35 mg/dL 0.450.79  Sodium 409135 - 811146 mmol/L 140  Potassium 3.5 - 5.3 mmol/L 4.9  Chloride 98 - 110 mmol/L 105  CO2 20 - 32 mmol/L 27  Calcium 8.6 - 10.3 mg/dL 9.1   Lipid Panel     Component Value Date/Time   CHOL 133 12/05/2017 1252   TRIG 167 (H) 12/05/2017 1252   HDL 26 (L) 12/05/2017 1252   CHOLHDL 5.1 (H) 12/05/2017 1252   LDLCALC 80 12/05/2017 1252      Diabetes Self-Management Education - 01/09/18 0817      Visit Information   Visit Type  First/Initial      Initial Visit   Diabetes Type  Type 2    Are you currently following a meal plan?  Yes    What type of meal plan do you follow?  lower sugar    Are you taking your medications as prescribed?  Not on Medications      Health Coping   How would you rate your overall health?  Fair      Psychosocial Assessment   Patient Belief/Attitude about Diabetes  Motivated to manage diabetes    Self-care barriers  Low literacy;Lack of material resources    Self-management support  Doctor's office;Family    Other persons present  Patient;Other (comment)    Patient Concerns  Nutrition/Meal planning    Special Needs  Simplified  materials    Preferred Learning Style  Hands on;Auditory;Visual    Learning Readiness  Ready    How often do you need to have someone help you when you read instructions, pamphlets, or other written materials from your doctor or pharmacy?  5 - Always    What is the last grade level you completed in school?  8      Dietary Intake   Breakfast  Frosted Flakes with  2% milk, or eggs and cheese and OJ 8 oz    Snack (morning)  biscuit, water or candy and chips.    Lunch  [place],  [place], with [facility] [place], garlic bread,  Sweet tea    Dinner  Fish sticks-10 ,, maac/cheese 1 cup, Soda    Beverage(s)  soda, sweet tea, water and juice      Exercise   Exercise Type  Light (walking / raking leaves)      Patient Education   Nutrition management   Role of diet in the treatment of diabetes and the relationship between the three main macronutrients and blood glucose level;Information on hints to eating out and maintain blood glucose control.  Physical activity and exercise   Role of exercise on diabetes management, blood pressure control and cardiac health.    Monitoring  Daily foot exams;Yearly dilated eye exam;Taught/discussed recording of test results and interpretation of SMBG.    Chronic complications  Relationship between chronic complications and blood glucose control    Psychosocial adjustment  Helped patient identify a support system for diabetes management;Identified and addressed patients feelings and concerns about diabetes;Worked with patient to identify barriers to care and solutions    Personal strategies to promote health  Lifestyle issues that need to be addressed for better diabetes care      Individualized Goals (developed by patient)   Nutrition  Follow meal plan discussed    Physical Activity  Exercise 3-5 times per week;30 minutes per day    Medications  Not Applicable    Monitoring   Not Applicable    Reducing Risk  do foot checks daily      Post-Education Assessment    Patient understands the diabetes disease and treatment process.  Needs Review    Patient understands incorporating nutritional management into lifestyle.  Needs Review    Patient undertands incorporating physical activity into lifestyle.  Needs Review    Patient understands using medications safely.  Needs Review    Patient understands monitoring blood glucose, interpreting and using results  Needs Review    Patient understands prevention, detection, and treatment of acute complications.  Needs Review    Patient understands prevention, detection, and treatment of chronic complications.  Needs Review    Patient understands how to develop strategies to address psychosocial issues.  Needs Review    Patient understands how to develop strategies to promote health/change behavior.  Needs Review      Outcomes   Expected Outcomes  Demonstrated interest in learning. Expect positive outcomes    Future DMSE  4-6 wks    Program Status  Completed       Individualized Plan for Diabetes Self-Management Training:   Learning Objective:  Patient will have a greater understanding of diabetes self-management. Patient education plan is to attend individual and/or group sessions per assessed needs and concerns.   Plan:   Patient Instructions  Goals 1 Follow My Plate 2. Cut out sweet tea, soda and juices 3. Drink only water or unsweet tea. 4. Limit snacks to fruit or vegetables 5. Increase walking to 30 minutes daily. 6. Lose 4 lbs in the month Choose unsweetened cereal   Expected Outcomes:  Demonstrated interest in learning. Expect positive outcomes  Education material provided: Meal plan card and My Plate  If problems or questions, patient to contact team via:  Phone and Email  Future DSME appointment: 4-6 wks

## 2018-01-14 ENCOUNTER — Ambulatory Visit: Payer: Medicare Other | Admitting: Family Medicine

## 2018-01-15 ENCOUNTER — Other Ambulatory Visit: Payer: Self-pay

## 2018-01-15 ENCOUNTER — Telehealth: Payer: Self-pay

## 2018-01-15 ENCOUNTER — Encounter: Payer: Self-pay | Admitting: Gastroenterology

## 2018-01-15 ENCOUNTER — Ambulatory Visit: Payer: Medicaid Other | Admitting: Gastroenterology

## 2018-01-15 DIAGNOSIS — D649 Anemia, unspecified: Secondary | ICD-10-CM

## 2018-01-15 MED ORDER — NA SULFATE-K SULFATE-MG SULF 17.5-3.13-1.6 GM/177ML PO SOLN: 1.0000 | ORAL | 0 refills | Status: DC

## 2018-01-15 NOTE — H&P (View-Only) (Signed)
Subjective:    Patient ID: Richard Abbott, male    DOB: [birth year], 52 y.o.   MRN: [id number] Aliene Beams, MD   HPI Here for IRON DEFICIENCY ANEMIA. Uses ibuprofen after working out. OCCASIONAL PAIN IN UPPER ABDOMEN: 1-2X/Q2WEEKS-BETTER AFTER BM. BMs: 1-2X/DAILY. APPETITE: GOOD. WEIGHT: STABLE.  PT DENIES FEVER, CHILLS, HEMATOCHEZIA, nausea, vomiting, melena, diarrhea, CHEST PAIN, SHORTNESS OF BREATH,  CHANGE IN BOWEL IN HABITS, constipation, abdominal pain, problems swallowing, problems with sedation, or heartburn or indigestion.  Past Medical History:  Diagnosis Date  . Anxiety   . Bipolar disorder (HCC)   . Diabetes mellitus without complication (HCC)   . GERD (gastroesophageal reflux disease)   . Schizophrenia [facility])    Past Surgical History:  Procedure Laterality Date  . LACERATION REPAIR     Allergies  Allergen Reactions  . Chlorpromazine Other (See Comments)    Jitter feeling   Current Outpatient Medications  Medication Sig    . benztropine (COGENTIN) 1 MG tablet Take 1 mg by mouth 3 (three) times daily.    . benztropine (COGENTIN) 2 MG tablet Take 2 mg by mouth every 8 (eight) hours.    . Cholecalciferol (D3-1000) 1000 units capsule Take 1 capsule (1,000 Units total) by mouth daily.    Marland Kitchen DIABETIC TUSSIN EX 100 MG/5ML liquid TAKE 10 MLS BY MOUTH 3 TIMES DAILY AS NEEDED FOR COUCH.    . divalproex (DEPAKOTE) 500 MG DR tablet Take 500 mg by mouth every morning.    . divalproex (DEPAKOTE) 500 MG DR tablet Take 1,000 mg by mouth every evening.    . docusate sodium (COLACE) 100 MG capsule Take 1 capsule (100 mg total) by mouth daily.    . haloperidol lactate (HALDOL) 5 MG/ML injection Inject 500 mg into the muscle See admin instructions. Every 4 weeks    . ibuprofen (ADVIL,MOTRIN) 200 MG tablet TAKE 2 TABLETS (400MG ) BY MOUTH EVERY 8 HOURS AS NEEDED FOR HEADACHE OR MILD PAIN    . omeprazole (PRILOSEC) 20 MG capsule Take 1 capsule (20 mg total) by mouth daily.    .  sertraline (ZOLOFT) 100 MG tablet Take 100 mg by mouth every morning.    . Skin Protectants, Misc. (EUCERIN) cream Apply topically as needed for dry skin.    . Sunscreens (BLISTEX LIP BALM) 2-2.5-6.6 % STCK Apply to lips prn for chap/dry lips    .      . moxifloxacin (AVELOX) 400 MG tablet Take 400 mg by mouth daily.     Family History  Problem Relation Age of Onset  . Colon cancer Neg Hx   . Colon polyps Neg Hx    Social History   Tobacco Use  . Smoking status: Current Every Day Smoker    Types: Cigarettes, Cigars  . Smokeless tobacco: Never Used  . Tobacco comment: Patient unsure of date where he began smoking but has been smoking for many years.  Substance Use Topics  . Alcohol use: No    Frequency: Never    Comment: Prior history of alcohol abuse  . Drug use: No    Comment: Prior history of drug abuse     Review of Systems PER HPI OTHERWISE ALL SYSTEMS ARE NEGATIVE.    Objective:   Physical Exam  Constitutional: He is oriented to person, place, and time. He appears well-developed and well-nourished. No distress.  HENT:  Head: Normocephalic and atraumatic.  Mouth/Throat: Oropharynx is clear and moist. No oropharyngeal exudate.  Eyes: Pupils are equal, round, and  reactive to light. No scleral icterus.  Neck: Normal range of motion. Neck supple.  Cardiovascular: Normal rate, regular rhythm and normal heart sounds.  Pulmonary/Chest: Effort normal and breath sounds normal. No respiratory distress.  Abdominal: Soft. Bowel sounds are normal. He exhibits no distension. There is no tenderness.  OBESE  Musculoskeletal: He exhibits no edema.  Lymphadenopathy:    He has no cervical adenopathy.  Neurological: He is alert and oriented to person, place, and time.  NO  NEW FOCAL DEFICITS  Psychiatric:  FLAT AFFECT, NL MOOD  Vitals reviewed.     Assessment & Plan:

## 2018-01-15 NOTE — Progress Notes (Signed)
cc'ed to pcp °

## 2018-01-15 NOTE — Patient Instructions (Signed)
DRINK WATER TO KEEP YOUR URINE LIGHT YELLOW. Do not drink DIET SODA, AVOID HIGH FRUCTOSE CORN SYRUP, chew SUGAR FREE GUM, OR USE ARTIFICIAL SWEETENERS. USE STEVIA AS A SWEETENER OR AGAVE NECTAR.   FOLLOW A HIGH FIBER DIET. SEE INFO BELOW.    FOLLOW A CLEAR LIQUID DIET THE DAY BEFORE YOUR COLONOSCOPY. COMPLETE COLONOSCOPY AND UPPER ENDOSCOPY NEXT WEEK. YOU MAY BRING THE ENEMA TO ADMINISTER IN THE PREOP AREA.  FOLLOW UP IN 6 MOS.    High-Fiber Diet A high-fiber diet changes your normal diet to include more whole grains, legumes, fruits, and vegetables. Changes in the diet involve replacing refined carbohydrates with unrefined foods. The calorie level of the diet is essentially unchanged. The Dietary Reference Intake (recommended amount) for adult males is 38 grams per day. For adult females, it is 25 grams per day. Pregnant and lactating women should consume 28 grams of fiber per day.Fiber is the intact part of a plant that is not broken down during digestion. Functional fiber is fiber that has been isolated from the plant to provide a beneficial effect in the body.  PURPOSE  Increase stool bulk.   Ease and regulate bowel movements.   Lower cholesterol.   REDUCE RISK OF COLON CANCER  INDICATIONS THAT YOU NEED MORE FIBER  Constipation and hemorrhoids.   Uncomplicated diverticulosis (intestine condition) and irritable bowel syndrome.   Weight management.   As a protective measure against hardening of the arteries (atherosclerosis), diabetes, and cancer.   GUIDELINES FOR INCREASING FIBER IN THE DIET  Start adding fiber to the diet slowly. A gradual increase of about 5 more grams (2 slices of whole-wheat bread, 2 servings of most fruits or vegetables, or 1 bowl of high-fiber cereal) per day is best. Too rapid an increase in fiber may result in constipation, flatulence, and bloating.   Drink enough water and fluids to keep your urine clear or pale yellow. Water, juice, or caffeine-free  drinks are recommended. Not drinking enough fluid may cause constipation.   Eat a variety of high-fiber foods rather than one type of fiber.   Try to increase your intake of fiber through using high-fiber foods rather than fiber pills or supplements that contain small amounts of fiber.   The goal is to change the types of food eaten. Do not supplement your present diet with high-fiber foods, but replace foods in your present diet.   INCLUDE A VARIETY OF FIBER SOURCES  Replace refined and processed grains with whole grains, canned fruits with fresh fruits, and incorporate other fiber sources. White rice, white breads, and most bakery goods contain little or no fiber.   Brown whole-grain rice, buckwheat oats, and many fruits and vegetables are all good sources of fiber. These include: broccoli, Brussels sprouts, cabbage, cauliflower, beets, sweet potatoes, white potatoes (skin on), carrots, tomatoes, eggplant, squash, berries, fresh fruits, and dried fruits.   Cereals appear to be the richest source of fiber. Cereal fiber is found in whole grains and bran. Bran is the fiber-rich outer coat of cereal grain, which is largely removed in refining. In whole-grain cereals, the bran remains. In breakfast cereals, the largest amount of fiber is found in those with "bran" in their names. The fiber content is sometimes indicated on the label.   You may need to include additional fruits and vegetables each day.   In baking, for 1 cup white flour, you may use the following substitutions:   1 cup whole-wheat flour minus 2 tablespoons.   1/2  cup white flour plus 1/2 cup whole-wheat flour.

## 2018-01-15 NOTE — Progress Notes (Signed)
Subjective:    Patient ID: Richard Abbott, male    DOB: [birth year], 52 y.o.   MRN: [id number] Aliene Beams, MD   HPI Here for IRON DEFICIENCY ANEMIA. Uses ibuprofen after working out. OCCASIONAL PAIN IN UPPER ABDOMEN: 1-2X/Q2WEEKS-BETTER AFTER BM. BMs: 1-2X/DAILY. APPETITE: GOOD. WEIGHT: STABLE.  PT DENIES FEVER, CHILLS, HEMATOCHEZIA, nausea, vomiting, melena, diarrhea, CHEST PAIN, SHORTNESS OF BREATH,  CHANGE IN BOWEL IN HABITS, constipation, abdominal pain, problems swallowing, problems with sedation, or heartburn or indigestion.  Past Medical History:  Diagnosis Date  . Anxiety   . Bipolar disorder (HCC)   . Diabetes mellitus without complication (HCC)   . GERD (gastroesophageal reflux disease)   . Schizophrenia [facility])    Past Surgical History:  Procedure Laterality Date  . LACERATION REPAIR     Allergies  Allergen Reactions  . Chlorpromazine Other (See Comments)    Jitter feeling   Current Outpatient Medications  Medication Sig    . benztropine (COGENTIN) 1 MG tablet Take 1 mg by mouth 3 (three) times daily.    . benztropine (COGENTIN) 2 MG tablet Take 2 mg by mouth every 8 (eight) hours.    . Cholecalciferol (D3-1000) 1000 units capsule Take 1 capsule (1,000 Units total) by mouth daily.    Marland Kitchen DIABETIC TUSSIN EX 100 MG/5ML liquid TAKE 10 MLS BY MOUTH 3 TIMES DAILY AS NEEDED FOR COUCH.    . divalproex (DEPAKOTE) 500 MG DR tablet Take 500 mg by mouth every morning.    . divalproex (DEPAKOTE) 500 MG DR tablet Take 1,000 mg by mouth every evening.    . docusate sodium (COLACE) 100 MG capsule Take 1 capsule (100 mg total) by mouth daily.    . haloperidol lactate (HALDOL) 5 MG/ML injection Inject 500 mg into the muscle See admin instructions. Every 4 weeks    . ibuprofen (ADVIL,MOTRIN) 200 MG tablet TAKE 2 TABLETS (400MG ) BY MOUTH EVERY 8 HOURS AS NEEDED FOR HEADACHE OR MILD PAIN    . omeprazole (PRILOSEC) 20 MG capsule Take 1 capsule (20 mg total) by mouth daily.    .  sertraline (ZOLOFT) 100 MG tablet Take 100 mg by mouth every morning.    . Skin Protectants, Misc. (EUCERIN) cream Apply topically as needed for dry skin.    . Sunscreens (BLISTEX LIP BALM) 2-2.5-6.6 % STCK Apply to lips prn for chap/dry lips    .      . moxifloxacin (AVELOX) 400 MG tablet Take 400 mg by mouth daily.     Family History  Problem Relation Age of Onset  . Colon cancer Neg Hx   . Colon polyps Neg Hx    Social History   Tobacco Use  . Smoking status: Current Every Day Smoker    Types: Cigarettes, Cigars  . Smokeless tobacco: Never Used  . Tobacco comment: Patient unsure of date where he began smoking but has been smoking for many years.  Substance Use Topics  . Alcohol use: No    Frequency: Never    Comment: Prior history of alcohol abuse  . Drug use: No    Comment: Prior history of drug abuse     Review of Systems PER HPI OTHERWISE ALL SYSTEMS ARE NEGATIVE.    Objective:   Physical Exam  Constitutional: He is oriented to person, place, and time. He appears well-developed and well-nourished. No distress.  HENT:  Head: Normocephalic and atraumatic.  Mouth/Throat: Oropharynx is clear and moist. No oropharyngeal exudate.  Eyes: Pupils are equal, round, and  reactive to light. No scleral icterus.  Neck: Normal range of motion. Neck supple.  Cardiovascular: Normal rate, regular rhythm and normal heart sounds.  Pulmonary/Chest: Effort normal and breath sounds normal. No respiratory distress.  Abdominal: Soft. Bowel sounds are normal. He exhibits no distension. There is no tenderness.  OBESE  Musculoskeletal: He exhibits no edema.  Lymphadenopathy:    He has no cervical adenopathy.  Neurological: He is alert and oriented to person, place, and time.  NO  NEW FOCAL DEFICITS  Psychiatric:  FLAT AFFECT, NL MOOD  Vitals reviewed.     Assessment & Plan:

## 2018-01-15 NOTE — Progress Notes (Signed)
ON RECALL  °

## 2018-01-15 NOTE — Assessment & Plan Note (Addendum)
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  DIFFERENTIAL DIAGNOSIS INCLUDES: COLON POLYPS, AVMs, H PYLORI GASTRITIS, CAMERON'S EROSIONS,  ATROPHIC GASTRITIS, CELIAC DISEASE, OR LESS LIKELY COLON CANCER.  DRINK WATER TO KEEP YOUR URINE LIGHT YELLOW. Do not drink DIET SODA, AVOID HIGH FRUCTOSE CORN SYRUP, chew SUGAR FREE GUM, OR USE ARTIFICIAL SWEETENERS. USE STEVIA AS A SWEETENER OR AGAVE NECTAR. FOLLOW A HIGH FIBER DIET.  HANDOUT GIVEN.  FOLLOW A CLEAR LIQUID DIET THE DAY BEFORE YOUR COLONOSCOPY. COMPLETE COLONOSCOPY AND UPPER ENDOSCOPY NEXT WEEK WITH [facility]. YOU MAY BRING THE ENEMA TO ADMINISTER IN THE PREOP AREA. DISCUSSED WITH PT AND POA PROCEDURE, BENEFITS, & RISKS: < 1% chance of medication reaction, bleeding, perforation, or rupture of spleen/liver.  FOLLOW UP IN 6 MOS.

## 2018-01-15 NOTE — Telephone Encounter (Signed)
Tried to call Tyra (caregiver) to inform of pre-op appt 02/06/18 at 12:45pm, no answer, unable to leave message d/t VM box full. Letter mailed.

## 2018-02-04 ENCOUNTER — Ambulatory Visit (INDEPENDENT_AMBULATORY_CARE_PROVIDER_SITE_OTHER): Payer: Medicaid Other | Admitting: Family Medicine

## 2018-02-04 ENCOUNTER — Telehealth: Payer: Self-pay

## 2018-02-04 ENCOUNTER — Encounter: Payer: Self-pay | Admitting: Family Medicine

## 2018-02-04 VITALS — BP 120/80 | HR 72 | Resp 16 | Ht 65.0 in | Wt 220.0 lb

## 2018-02-04 DIAGNOSIS — E1169 Type 2 diabetes mellitus with other specified complication: Secondary | ICD-10-CM | POA: Insufficient documentation

## 2018-02-04 DIAGNOSIS — Z23 Encounter for immunization: Secondary | ICD-10-CM

## 2018-02-04 DIAGNOSIS — E785 Hyperlipidemia, unspecified: Secondary | ICD-10-CM | POA: Diagnosis not present

## 2018-02-04 DIAGNOSIS — F09 Unspecified mental disorder due to known physiological condition: Secondary | ICD-10-CM

## 2018-02-04 DIAGNOSIS — F209 Schizophrenia, unspecified: Secondary | ICD-10-CM | POA: Diagnosis not present

## 2018-02-04 MED ORDER — ATORVASTATIN CALCIUM 20 MG PO TABS: 20.0000 mg | ORAL_TABLET | Freq: Every day | ORAL | 3 refills | Status: DC

## 2018-02-04 NOTE — Patient Instructions (Signed)
Richard Abbott  02/04/2018     @PREFPERIOPPHARMACY @   Your procedure is scheduled on  02/11/2018   Report to [facility] at  1100   A.M.  Call this number if you have problems the morning of surgery:  [phone]   Remember:  Do not eat food or drink liquids after midnight.  Take these medicines the morning of surgery with A SIP OF WATER  Cogentin, depakote, prilosec, zoloft.   Do not wear jewelry, make-up or nail polish.  Do not wear lotions, powders, or perfumes, or deodorant.  Do not shave 48 hours prior to surgery.  Men may shave face and neck.  Do not bring valuables to the hospital.  [facility] any belongings or valuables.  Contacts, dentures or bridgework may not be worn into surgery.  Leave your suitcase in the car.  After surgery it may be brought to your room.  For patients admitted to the hospital, discharge time will be determined by your treatment team.  Patients discharged the day of surgery will not be allowed to drive home.   Name and phone number of your driver:   family Special instructions:  Follow the diet and prep instructions given to you by Dr Evelina DunField's office.  Please read over the following fact sheets that you were given. Anesthesia Post-op Instructions and Care and Recovery After Surgery       Esophagogastroduodenoscopy Esophagogastroduodenoscopy (EGD) is a procedure to examine the lining of the esophagus, stomach, and first part of the small intestine (duodenum). This procedure is done to check for problems such as inflammation, bleeding, ulcers, or growths. During this procedure, a long, flexible, lighted tube with a camera attached (endoscope) is inserted down the throat. Tell a health care provider about:  Any allergies you have.  All medicines you are taking, including vitamins, herbs, eye drops, creams, and over-the-counter medicines.  Any problems you or family members have had with anesthetic  medicines.  Any blood disorders you have.  Any surgeries you have had.  Any medical conditions you have.  Whether you are pregnant or may be pregnant. What are the risks? Generally, this is a safe procedure. However, problems may occur, including:  Infection.  Bleeding.  A tear (perforation) in the esophagus, stomach, or duodenum.  Trouble breathing.  Excessive sweating.  Spasms of the larynx.  A slowed heartbeat.  Low blood pressure.  What happens before the procedure?  Follow instructions from your health care provider about eating or drinking restrictions.  Ask your health care provider about: ? Changing or stopping your regular medicines. This is especially important if you are taking diabetes medicines or blood thinners. ? Taking medicines such as aspirin and ibuprofen. These medicines can thin your blood. Do not take these medicines before your procedure if your health care provider instructs you not to.  Plan to have someone take you home after the procedure.  If you wear dentures, be ready to remove them before the procedure. What happens during the procedure?  To reduce your risk of infection, your health care team will wash or sanitize their hands.  An IV tube will be put in a vein in your hand or arm. You will get medicines and fluids through this tube.  You will be given one or more of the following: ? A medicine to help you relax (sedative). ? A medicine to numb the area (local  anesthetic). This medicine may be sprayed into your throat. It will make you feel more comfortable and keep you from gagging or coughing during the procedure. ? A medicine for pain.  A mouth guard may be placed in your mouth to protect your teeth and to keep you from biting on the endoscope.  You will be asked to lie on your left side.  The endoscope will be lowered down your throat into your esophagus, stomach, and duodenum.  Air will be put into the endoscope. This will  help your health care provider see better.  The lining of your esophagus, stomach, and duodenum will be examined.  Your health care provider may: ? Take a tissue sample so it can be looked at in a lab (biopsy). ? Remove growths. ? Remove objects (foreign bodies) that are stuck. ? Treat any bleeding with medicines or other devices that stop tissue from bleeding. ? Widen (dilate) or stretch narrowed areas of your esophagus and stomach.  The endoscope will be taken out. The procedure may vary among health care providers and hospitals. What happens after the procedure?  Your blood pressure, heart rate, breathing rate, and blood oxygen level will be monitored often until the medicines you were given have worn off.  Do not eat or drink anything until the numbing medicine has worn off and your gag reflex has returned. This information is not intended to replace advice given to you by your health care provider. Make sure you discuss any questions you have with your health care provider. Document Released: 02/15/2005 Document Revised: 03/22/2016 Document Reviewed: 09/08/2015 Elsevier Interactive Patient Education  2018 [employer]. Esophagogastroduodenoscopy, Care After Refer to this sheet in the next few weeks. These instructions provide you with information about caring for yourself after your procedure. Your health care provider may also give you more specific instructions. Your treatment has been planned according to current medical practices, but problems sometimes occur. Call your health care provider if you have any problems or questions after your procedure. What can I expect after the procedure? After the procedure, it is common to have:  A sore throat.  Nausea.  Bloating.  Dizziness.  Fatigue.  Follow these instructions at home:  Do not eat or drink anything until the numbing medicine (local anesthetic) has worn off and your gag reflex has returned. You will know that the  local anesthetic has worn off when you can swallow comfortably.  Do not drive for 24 hours if you received a medicine to help you relax (sedative).  If your health care provider took a tissue sample for testing during the procedure, make sure to get your test results. This is your responsibility. Ask your health care provider or the department performing the test when your results will be ready.  Keep all follow-up visits as told by your health care provider. This is important. Contact a health care provider if:  You cannot stop coughing.  You are not urinating.  You are urinating less than usual. Get help right away if:  You have trouble swallowing.  You cannot eat or drink.  You have throat or chest pain that gets worse.  You are dizzy or light-headed.  You faint.  You have nausea or vomiting.  You have chills.  You have a fever.  You have severe abdominal pain.  You have black, tarry, or bloody stools. This information is not intended to replace advice given to you by your health care provider. Make sure you discuss any  questions you have with your health care provider. Document Released: 10/01/2012 Document Revised: 03/22/2016 Document Reviewed: 09/08/2015 Elsevier Interactive Patient Education  2018 [employer].  Colonoscopy, Adult A colonoscopy is an exam to look at the large intestine. It is done to check for problems, such as:  Lumps (tumors).  Growths (polyps).  Swelling (inflammation).  Bleeding.  What happens before the procedure? Eating and drinking Follow instructions from your doctor about eating and drinking. These instructions may include:  A few days before the procedure - follow a low-fiber diet. ? Avoid nuts. ? Avoid seeds. ? Avoid dried fruit. ? Avoid raw fruits. ? Avoid vegetables.  1-3 days before the procedure - follow a clear liquid diet. Avoid liquids that have red or purple dye. Drink only clear liquids, such as: ? Clear broth  or bouillon. ? Black coffee or tea. ? Clear juice. ? Clear soft drinks or sports drinks. ? Gelatin dessert. ? Popsicles.  On the day of the procedure - do not eat or drink anything during the 2 hours before the procedure.  Bowel prep If you were prescribed an oral bowel prep:  Take it as told by your doctor. Starting the day before your procedure, you will need to drink a lot of liquid. The liquid will cause you to poop (have bowel movements) until your poop is almost clear or light green.  If your skin or butt gets irritated from diarrhea, you may: ? Wipe the area with wipes that have medicine in them, such as adult wet wipes with aloe and vitamin E. ? Put something on your skin that soothes the area, such as petroleum jelly.  If you throw up (vomit) while drinking the bowel prep, take a break for up to 60 minutes. Then begin the bowel prep again. If you keep throwing up and you cannot take the bowel prep without throwing up, call your doctor.  General instructions  Ask your doctor about changing or stopping your normal medicines. This is important if you take diabetes medicines or blood thinners.  Plan to have someone take you home from the hospital or clinic. What happens during the procedure?  An IV tube may be put into one of your veins.  You will be given medicine to help you relax (sedative).  To reduce your risk of infection: ? Your doctors will wash their hands. ? Your anal area will be washed with soap.  You will be asked to lie on your side with your knees bent.  Your doctor will get a long, thin, flexible tube ready. The tube will have a camera and a light on the end.  The tube will be put into your anus.  The tube will be gently put into your large intestine.  Air will be delivered into your large intestine to keep it open. You may feel some pressure or cramping.  The camera will be used to take photos.  A small tissue sample may be removed from your body  to be looked at under a microscope (biopsy). If any possible problems are found, the tissue will be sent to a lab for testing.  If small growths are found, your doctor may remove them and have them checked for cancer.  The tube that was put into your anus will be slowly removed. The procedure may vary among doctors and hospitals. What happens after the procedure?  Your doctor will check on you often until the medicines you were given have worn off.  Do not  drive for 24 hours after the procedure.  You may have a small amount of blood in your poop.  You may pass gas.  You may have mild cramps or bloating in your belly (abdomen).  It is up to you to get the results of your procedure. Ask your doctor, or the department performing the procedure, when your results will be ready. This information is not intended to replace advice given to you by your health care provider. Make sure you discuss any questions you have with your health care provider. Document Released: 11/17/2010 Document Revised: 08/15/2016 Document Reviewed: 12/27/2015 Elsevier Interactive Patient Education  2017 [employer]  Colonoscopy, Adult, Care After This sheet gives you information about how to care for yourself after your procedure. Your health care provider may also give you more specific instructions. If you have problems or questions, contact your health care provider. What can I expect after the procedure? After the procedure, it is common to have:  A small amount of blood in your stool for 24 hours after the procedure.  Some gas.  Mild abdominal cramping or bloating.  Follow these instructions at home: General instructions   For the first 24 hours after the procedure: ? Do not drive or use machinery. ? Do not sign important documents. ? Do not drink alcohol. ? Do your regular daily activities at a slower pace than normal. ? Eat soft, easy-to-digest foods. ? Rest often.  Take over-the-counter or  prescription medicines only as told by your health care provider.  It is up to you to get the results of your procedure. Ask your health care provider, or the department performing the procedure, when your results will be ready. Relieving cramping and bloating  Try walking around when you have cramps or feel bloated.  Apply heat to your abdomen as told by your health care provider. Use a heat source that your health care provider recommends, such as a moist heat pack or a heating pad. ? Place a towel between your skin and the heat source. ? Leave the heat on for 20-30 minutes. ? Remove the heat if your skin turns bright red. This is especially important if you are unable to feel pain, heat, or cold. You may have a greater risk of getting burned. Eating and drinking  Drink enough fluid to keep your urine clear or pale yellow.  Resume your normal diet as instructed by your health care provider. Avoid heavy or fried foods that are hard to digest.  Avoid drinking alcohol for as long as instructed by your health care provider. Contact a health care provider if:  You have blood in your stool 2-3 days after the procedure. Get help right away if:  You have more than a small spotting of blood in your stool.  You pass large blood clots in your stool.  Your abdomen is swollen.  You have nausea or vomiting.  You have a fever.  You have increasing abdominal pain that is not relieved with medicine. This information is not intended to replace advice given to you by your health care provider. Make sure you discuss any questions you have with your health care provider. Document Released: 05/29/2004 Document Revised: 07/09/2016 Document Reviewed: 12/27/2015 Elsevier Interactive Patient Education  2018 [place] Anesthesia is a term that refers to techniques, procedures, and medicines that help a person stay safe and comfortable during a medical procedure.  Monitored anesthesia care, or sedation, is one type of anesthesia. Your  anesthesia specialist may recommend sedation if you will be having a procedure that does not require you to be unconscious, such as:  Cataract surgery.  A dental procedure.  A biopsy.  A colonoscopy.  During the procedure, you may receive a medicine to help you relax (sedative). There are three levels of sedation:  Mild sedation. At this level, you may feel awake and relaxed. You will be able to follow directions.  Moderate sedation. At this level, you will be sleepy. You may not remember the procedure.  Deep sedation. At this level, you will be asleep. You will not remember the procedure.  The more medicine you are given, the deeper your level of sedation will be. Depending on how you respond to the procedure, the anesthesia specialist may change your level of sedation or the type of anesthesia to fit your needs. An anesthesia specialist will monitor you closely during the procedure. Let your health care provider know about:  Any allergies you have.  All medicines you are taking, including vitamins, herbs, eye drops, creams, and over-the-counter medicines.  Any use of steroids (by mouth or as a cream).  Any problems you or family members have had with sedatives and anesthetic medicines.  Any blood disorders you have.  Any surgeries you have had.  Any medical conditions you have, such as sleep apnea.  Whether you are pregnant or may be pregnant.  Any use of cigarettes, alcohol, or street drugs. What are the risks? Generally, this is a safe procedure. However, problems may occur, including:  Getting too much medicine (oversedation).  Nausea.  Allergic reaction to medicines.  Trouble breathing. If this happens, a breathing tube may be used to help with breathing. It will be removed when you are awake and breathing on your own.  Heart trouble.  Lung trouble.  Before the procedure Staying  hydrated Follow instructions from your health care provider about hydration, which may include:  Up to 2 hours before the procedure - you may continue to drink clear liquids, such as water, clear fruit juice, black coffee, and plain tea.  Eating and drinking restrictions Follow instructions from your health care provider about eating and drinking, which may include:  8 hours before the procedure - stop eating heavy meals or foods such as meat, fried foods, or fatty foods.  6 hours before the procedure - stop eating light meals or foods, such as toast or cereal.  6 hours before the procedure - stop drinking milk or drinks that contain milk.  2 hours before the procedure - stop drinking clear liquids.  Medicines Ask your health care provider about:  Changing or stopping your regular medicines. This is especially important if you are taking diabetes medicines or blood thinners.  Taking medicines such as aspirin and ibuprofen. These medicines can thin your blood. Do not take these medicines before your procedure if your health care provider instructs you not to.  Tests and exams  You will have a physical exam.  You may have blood tests done to show: ? How well your kidneys and liver are working. ? How well your blood can clot.  General instructions  Plan to have someone take you home from the hospital or clinic.  If you will be going home right after the procedure, plan to have someone with you for 24 hours.  What happens during the procedure?  Your blood pressure, heart rate, breathing, level of pain and overall condition will be monitored.  An IV tube will  be inserted into one of your veins.  Your anesthesia specialist will give you medicines as needed to keep you comfortable during the procedure. This may mean changing the level of sedation.  The procedure will be performed. After the procedure  Your blood pressure, heart rate, breathing rate, and blood oxygen level  will be monitored until the medicines you were given have worn off.  Do not drive for 24 hours if you received a sedative.  You may: ? Feel sleepy, clumsy, or nauseous. ? Feel forgetful about what happened after the procedure. ? Have a sore throat if you had a breathing tube during the procedure. ? Vomit. This information is not intended to replace advice given to you by your health care provider. Make sure you discuss any questions you have with your health care provider. Document Released: 07/11/2005 Document Revised: 03/23/2016 Document Reviewed: 02/05/2016 Elsevier Interactive Patient Education  2018 [place], Care After These instructions provide you with information about caring for yourself after your procedure. Your health care provider may also give you more specific instructions. Your treatment has been planned according to current medical practices, but problems sometimes occur. Call your health care provider if you have any problems or questions after your procedure. What can I expect after the procedure? After your procedure, it is common to:  Feel sleepy for several hours.  Feel clumsy and have poor balance for several hours.  Feel forgetful about what happened after the procedure.  Have poor judgment for several hours.  Feel nauseous or vomit.  Have a sore throat if you had a breathing tube during the procedure.  Follow these instructions at home: For at least 24 hours after the procedure:   Do not: ? Participate in activities in which you could fall or become injured. ? Drive. ? Use heavy machinery. ? Drink alcohol. ? Take sleeping pills or medicines that cause drowsiness. ? Make important decisions or sign legal documents. ? Take care of children on your own.  Rest. Eating and drinking  Follow the diet that is recommended by your health care provider.  If you vomit, drink water, juice, or soup when you can drink without  vomiting.  Make sure you have little or no nausea before eating solid foods. General instructions  Have a responsible adult stay with you until you are awake and alert.  Take over-the-counter and prescription medicines only as told by your health care provider.  If you smoke, do not smoke without supervision.  Keep all follow-up visits as told by your health care provider. This is important. Contact a health care provider if:  You keep feeling nauseous or you keep vomiting.  You feel light-headed.  You develop a rash.  You have a fever. Get help right away if:  You have trouble breathing. This information is not intended to replace advice given to you by your health care provider. Make sure you discuss any questions you have with your health care provider. Document Released: 02/05/2016 Document Revised: 06/06/2016 Document Reviewed: 02/05/2016 Elsevier Interactive Patient Education  [employer].

## 2018-02-04 NOTE — Telephone Encounter (Signed)
Spoke with Cierra at the facility about the need to have patients sleep/anxiety addressed by psych. She was concerned that he was staying up at night wanting to smoke and that hes smokes excessively all day and night. Wanted something like a  "cigarette plan" or restriction faxed over so that he can only smoke certain times so he doesn't do it back to back all day.  Facility phone [phone]

## 2018-02-04 NOTE — Progress Notes (Signed)
Patient ID: Richard Abbott, male    DOB: 1966/08/14, 52 y.o.   MRN: [id number]  Chief Complaint  Patient presents with  . Diabetes    follow up visit- Facility requesting PCS forms be completed     Allergies Chlorpromazine  Subjective:   Richard Abbott is a 52 y.o. male who presents to [facility] today.  HPI Gilmore presents today with nurse from his group home.  He is here today for follow-up of his diabetes and to get paperwork completed.  He has been going to diabetic education and nutritionist.  He reports that he has been trying to do a little more exercise and lose weight.  He reports that he knows he needs to lose the area around his belly but he would like to gain in his muscle mass.  He occasionally still smokes a cigarette.  He denies any chest pain, shortness of breath, or swelling in his extremities.  He does have an appointment with podiatry tomorrow to get his toenails clipped.  He is not currently on any medication for his diabetes but has been trying to lose weight and change his diet.  The assistant from the group home request that I make a change in patient's psychiatric medications due to his increased agitation over the past several weeks.  He is followed by his psychiatrist for schizophrenia and has a long history of psychiatric hospitalization secondary to his mental disorders.  She reports that he has been more anxious over the past couple weeks.   Past Medical History:  Diagnosis Date  . Anxiety   . Bipolar disorder (HCC)   . Diabetes mellitus without complication (HCC)   . GERD (gastroesophageal reflux disease)   . Schizophrenia [facility])     Past Surgical History:  Procedure Laterality Date  . LACERATION REPAIR      Family History  Problem Relation Age of Onset  . Colon cancer Neg Hx   . Colon polyps Neg Hx      Social History   Socioeconomic History  . Marital status: Single    Spouse name: Not on file  . Number of children: Not on  file  . Years of education: Not on file  . Highest education level: Not on file  Occupational History  . Not on file  Social Needs  . Financial resource strain: Not on file  . Food insecurity:    Worry: Not on file    Inability: Not on file  . Transportation needs:    Medical: Not on file    Non-medical: Not on file  Tobacco Use  . Smoking status: Current Every Day Smoker    Types: Cigarettes, Cigars  . Smokeless tobacco: Never Used  . Tobacco comment: Patient unsure of date where he began smoking but has been smoking for many years.  Substance and Sexual Activity  . Alcohol use: No    Frequency: Never    Comment: Prior history of alcohol abuse  . Drug use: No    Comment: Prior history of drug abuse  . Sexual activity: Not Currently    Partners: Male    Comment: Denies that he is currently sexually active  Lifestyle  . Physical activity:    Days per week: Not on file    Minutes per session: Not on file  . Stress: Not on file  Relationships  . Social connections:    Talks on phone: Not on file    Gets together: Not on file  Attends religious service: Not on file    Active member of club or organization: Not on file    Attends meetings of clubs or organizations: Not on file    Relationship status: Not on file  Other Topics Concern  . Not on file  Social History Narrative   [place] up in  [place], [place]    Reports that he has a big family.    Has one brother and five sisters.   Father died. Mother living.  Does not have contact with his family.   Wants to move back to [place].      Moved in group home on 07/10/2017 [facility].    [place] [facility] [place] after being incarcerated for drugs/alcohol/burgularly.    DSS is Guardian.        Review of Systems  Constitutional: Negative for appetite change, chills, fever and unexpected weight change.  HENT: Negative for trouble swallowing and voice change.   Eyes: Negative for visual  disturbance.  Respiratory: Negative for cough, chest tightness, shortness of breath and wheezing.   Cardiovascular: Negative for chest pain, palpitations and leg swelling.  Gastrointestinal: Negative for abdominal pain, diarrhea, nausea and vomiting.  Genitourinary: Negative for decreased urine volume, dysuria and frequency.  Skin: Negative for rash.  Neurological: Negative for dizziness, tremors, syncope, facial asymmetry, weakness and headaches.  Hematological: Negative for adenopathy. Does not bruise/bleed easily.     Objective:   BP 120/80   Pulse 72   Resp 16   Ht 5\' 5"  (1.651 m)   Wt 220 lb (99.8 kg)   SpO2 96%   BMI 36.61 kg/m   Physical Exam  Constitutional: He is oriented to person, place, and time. He appears well-developed and well-nourished.  HENT:  Head: Normocephalic and atraumatic.  Eyes: Pupils are equal, round, and reactive to light. EOM are normal. No scleral icterus.  Neck: Normal range of motion. Neck supple. No thyromegaly present.  Cardiovascular: Normal rate, regular rhythm and normal heart sounds.  Pulses:      Dorsalis pedis pulses are 2+ on the right side, and 2+ on the left side.       Posterior tibial pulses are 2+ on the right side, and 2+ on the left side.  Pulmonary/Chest: Effort normal and breath sounds normal.  Abdominal: Soft. Bowel sounds are normal.  Musculoskeletal: He exhibits no edema.       Right foot: There is normal range of motion and no deformity.       Left foot: There is normal range of motion and no deformity.  Feet:  Right Foot:  Protective Sensation: 7 sites tested. 7 sites sensed.  Skin Integrity: Negative for ulcer, blister, skin breakdown, erythema or warmth.  Left Foot:  Protective Sensation: 7 sites tested. 7 sites sensed.  Skin Integrity: Negative for ulcer, blister, skin breakdown, erythema or warmth.  Neurological: He is alert and oriented to person, place, and time. No cranial nerve deficit.  Skin: Skin is warm,  dry and intact.  Vitals reviewed.    Assessment and Plan  1. Type 2 diabetes mellitus with other specified complication, without long-term current use of insulin (HCC) Check labs today.  If A1c is not decreased or if it has elevated then we will plan to initiate metformin at this time.  Diet, exercise, and weight loss recommendations discussed with patient in office visit today.  Diabetic foot exam performed. - Microalbumin / creatinine urine ratio - Hemoglobin A1c  2. Morbid obesity (HCC) The patient is asked to make an attempt to improve diet and exercise patterns to aid in medical management of this problem.   3. Hyperlipidemia associated with type 2 diabetes mellitus (HCC) Hyperlipidemia and the associated risk of ASCVD were discussed today. In limited and general terms Primary vs. Secondary prevention of ASCVD were discussed and how it relates to patient morbidity, mortality, and quality of life. Shared decision making with patient including the risks of statins vs.benefits of ASCVD risk reduction discussed.  Risks of stains discussed including myopathy, rhabdomyoloysis, liver problems, increased risk of diabetes discussed. We discussed heart healthy diet, lifestyle modifications, risk factor modifications, and adherence to the recommended treatment plan. We discussed the need to periodically monitor lipid panel and liver function tests while on statin therapy. Tobacco cessation was recommended.   - atorvastatin (LIPITOR) 20 MG tablet; Take 1 tablet (20 mg total) by mouth daily.  Dispense: 90 tablet; Refill: 3  4. Need for pneumococcal vaccination Vaccination given - Pneumococcal polysaccharide vaccine 23-valent greater than or equal to 2yo subcutaneous/IM  5. Need for Tdap vaccination Vaccination administered- Tdap vaccine greater than or equal to 7yo IM  6. Schizophrenia, unspecified type [facility]) Did discuss with assistant today and had nurse in our office call group home to speak  with administrator.  Explained to them that patient is followed by psychiatrist and is on multiple chronic medications due to his schizophrenia and other mental disabilities.  I explained to them in detail that I would not alter or change these medications that he is under direct care of a psychiatrist.  He is not a threat to himself now or threat to anyone else.  It is recommended that he follow-up with psychiatry and that they contact his psychiatrist today and relay their concerns.  Understanding was voiced.  7. Cognitive dysfunction Does have limited cognitive abilities.  I did discuss with him diet, exercise, and the reason for medications and vaccinations today.  His paperwork for personal care services is completed.  Office visit was 25 minutes.  Greater than 50% spent counseling and coordinating care.  Patient has upcoming colonoscopy scheduled for 02/14/18 with Dr. Darrick Penna.  Return in about 3 months (around 05/06/2018). Aliene Beams, MD 02/04/2018

## 2018-02-05 ENCOUNTER — Encounter: Payer: Self-pay | Admitting: Podiatry

## 2018-02-05 ENCOUNTER — Other Ambulatory Visit (HOSPITAL_COMMUNITY)
Admission: AD | Admit: 2018-02-05 | Discharge: 2018-02-05 | Disposition: A | Discharge status: Home / Self Care | Payer: Medicare Other | Source: Other Acute Inpatient Hospital | Attending: Family Medicine | Admitting: Family Medicine

## 2018-02-05 ENCOUNTER — Ambulatory Visit: Payer: Medicaid Other | Admitting: Podiatry

## 2018-02-05 VITALS — BP 117/79 | HR 76

## 2018-02-05 DIAGNOSIS — M79675 Pain in left toe(s): Secondary | ICD-10-CM

## 2018-02-05 DIAGNOSIS — E1169 Type 2 diabetes mellitus with other specified complication: Secondary | ICD-10-CM | POA: Diagnosis present

## 2018-02-05 DIAGNOSIS — B351 Tinea unguium: Secondary | ICD-10-CM | POA: Diagnosis not present

## 2018-02-05 DIAGNOSIS — M79674 Pain in right toe(s): Secondary | ICD-10-CM

## 2018-02-05 DIAGNOSIS — E119 Type 2 diabetes mellitus without complications: Secondary | ICD-10-CM

## 2018-02-05 LAB — HEMOGLOBIN A1C
EAG (MMOL/L): 7.3 (calc)
HEMOGLOBIN A1C: 6.2 %{Hb} — AB (ref <5.7)
Mean Plasma Glucose: 131 (calc)

## 2018-02-05 NOTE — Progress Notes (Signed)
This patient presents to the office with chief complaint of long thick nails and diabetic feet.  He also says he has a painful callus on both big toes.   This patient  says he is having  pain and discomfort in his right big toe..  This patient says he has long thick painful nails.  These nails and callus  are painful walking and wearing shoes.  He has no history of infection or drainage from both feet.  This patient presents the office today for treatment of the  long nails, callus  and a foot evaluation due to history of  diabetes.  General Appearance  Alert, conversant and in no acute stress.  Vascular  Dorsalis pedis and posterior tibial  pulses are palpable  bilaterally.  Capillary return is within normal limits  bilaterally. Temperature is within normal limits  bilaterally.  Neurologic  Senn-Weinstein monofilament wire test within normal limits  bilaterally. Muscle power within normal limits bilaterally.  Nails Thick disfigured discolored nails with subungual debris  from hallux  bilaterally. No evidence of bacterial infection or drainage bilaterally.  Orthopedic  IPJ DJD right hallux.  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  No signs of infections or ulcers noted.   Pinch callus hallux  B/L.  Onychomycosis  Pinch callus  B/L secondary FHL. Diabetes with no foot complications  IE  Debride nails x 10.  A diabetic foot exam was performed and there is no evidence of any vascular or neurologic pathology.   RTC 4 months.   Helane GuntherGregory Murphy Bundick DPM

## 2018-02-05 NOTE — Telephone Encounter (Signed)
Patient needs to follow up with his psychiatrist. I believe that he is smoking more due to increased agitation and mood disturbance. The group home needs to have a regulation that he does not smoke but during certain hours. I have already recommended this to patient, but I will not write a medical order to that effect because that is his decision. The can have a rule that patients can only smoke during certain hours. Medically, it is better for him not to smoke. Please advise of this information.

## 2018-02-05 NOTE — Telephone Encounter (Signed)
Left message to return call 

## 2018-02-06 ENCOUNTER — Other Ambulatory Visit: Payer: Self-pay

## 2018-02-06 ENCOUNTER — Encounter (HOSPITAL_COMMUNITY)
Admission: RE | Admit: 2018-02-06 | Discharge: 2018-02-06 | Disposition: A | Discharge status: Home / Self Care | Payer: Medicare Other | Source: Ambulatory Visit | Attending: Gastroenterology | Admitting: Gastroenterology

## 2018-02-06 ENCOUNTER — Encounter (HOSPITAL_COMMUNITY): Payer: Self-pay

## 2018-02-06 DIAGNOSIS — R9431 Abnormal electrocardiogram [ECG] [EKG]: Secondary | ICD-10-CM | POA: Diagnosis not present

## 2018-02-06 DIAGNOSIS — Z01812 Encounter for preprocedural laboratory examination: Secondary | ICD-10-CM | POA: Diagnosis not present

## 2018-02-06 DIAGNOSIS — Z01818 Encounter for other preprocedural examination: Secondary | ICD-10-CM | POA: Diagnosis present

## 2018-02-06 DIAGNOSIS — I498 Other specified cardiac arrhythmias: Secondary | ICD-10-CM | POA: Insufficient documentation

## 2018-02-06 HISTORY — DX: Pure hypercholesterolemia, unspecified: E78.00

## 2018-02-06 LAB — CBC WITH DIFFERENTIAL/PLATELET
Basophils Absolute: 0 10*3/uL (ref 0.0–0.1)
Basophils Relative: 1 %
EOS ABS: 0.1 10*3/uL (ref 0.0–0.7)
EOS PCT: 3 %
HCT: 39.6 % (ref 39.0–52.0)
Hemoglobin: 12.5 g/dL — ABNORMAL LOW (ref 13.0–17.0)
LYMPHS ABS: 1.5 10*3/uL (ref 0.7–4.0)
LYMPHS PCT: 37 %
MCH: 28.3 pg (ref 26.0–34.0)
MCHC: 31.6 g/dL (ref 30.0–36.0)
MCV: 89.6 fL (ref 78.0–100.0)
MONO ABS: 0.5 10*3/uL (ref 0.1–1.0)
MONOS PCT: 13 %
Neutro Abs: 1.9 10*3/uL (ref 1.7–7.7)
Neutrophils Relative %: 48 %
PLATELETS: 329 10*3/uL (ref 150–400)
RBC: 4.42 MIL/uL (ref 4.22–5.81)
RDW: 15.2 % (ref 11.5–15.5)
WBC: 4.1 10*3/uL (ref 4.0–10.5)

## 2018-02-06 LAB — BASIC METABOLIC PANEL
Anion gap: 12 (ref 5–15)
BUN: 13 mg/dL (ref 6–20)
CO2: 22 mmol/L (ref 22–32)
CREATININE: 0.87 mg/dL (ref 0.61–1.24)
Calcium: 9.1 mg/dL (ref 8.9–10.3)
Chloride: 106 mmol/L (ref 101–111)
GFR calc Af Amer: >60 mL/min (ref >60)
GLUCOSE: 84 mg/dL (ref 65–99)
POTASSIUM: 3.9 mmol/L (ref 3.5–5.1)
Sodium: 140 mmol/L (ref 135–145)

## 2018-02-06 LAB — MICROALBUMIN / CREATININE URINE RATIO
Creatinine, Urine: 296.4 mg/dL
MICROALB UR: 5.5 ug/mL — AB
Microalb Creat Ratio: 1.9 mg/g creat (ref 0.0–30.0)

## 2018-02-06 NOTE — Telephone Encounter (Signed)
Cierra informed of message below. She will contact psychiatry to follow up

## 2018-02-11 ENCOUNTER — Ambulatory Visit (HOSPITAL_COMMUNITY): Payer: Medicaid Other | Admitting: Anesthesiology

## 2018-02-11 ENCOUNTER — Encounter (HOSPITAL_COMMUNITY)
Admission: RE | Disposition: A | Discharge status: Home / Self Care | Payer: Self-pay | Source: Ambulatory Visit | Attending: Gastroenterology

## 2018-02-11 ENCOUNTER — Ambulatory Visit (HOSPITAL_COMMUNITY)
Admission: RE | Admit: 2018-02-11 | Discharge: 2018-02-11 | Disposition: A | Discharge status: Home / Self Care | Payer: Medicaid Other | Source: Ambulatory Visit | Attending: Gastroenterology | Admitting: Gastroenterology

## 2018-02-11 ENCOUNTER — Encounter (HOSPITAL_COMMUNITY): Payer: Self-pay | Admitting: Gastroenterology

## 2018-02-11 DIAGNOSIS — K635 Polyp of colon: Secondary | ICD-10-CM | POA: Diagnosis not present

## 2018-02-11 DIAGNOSIS — K219 Gastro-esophageal reflux disease without esophagitis: Secondary | ICD-10-CM | POA: Insufficient documentation

## 2018-02-11 DIAGNOSIS — F17210 Nicotine dependence, cigarettes, uncomplicated: Secondary | ICD-10-CM | POA: Diagnosis not present

## 2018-02-11 DIAGNOSIS — F319 Bipolar disorder, unspecified: Secondary | ICD-10-CM | POA: Diagnosis not present

## 2018-02-11 DIAGNOSIS — K648 Other hemorrhoids: Secondary | ICD-10-CM | POA: Insufficient documentation

## 2018-02-11 DIAGNOSIS — F419 Anxiety disorder, unspecified: Secondary | ICD-10-CM | POA: Insufficient documentation

## 2018-02-11 DIAGNOSIS — K2970 Gastritis, unspecified, without bleeding: Secondary | ICD-10-CM

## 2018-02-11 DIAGNOSIS — Z79899 Other long term (current) drug therapy: Secondary | ICD-10-CM | POA: Insufficient documentation

## 2018-02-11 DIAGNOSIS — K449 Diaphragmatic hernia without obstruction or gangrene: Secondary | ICD-10-CM | POA: Insufficient documentation

## 2018-02-11 DIAGNOSIS — F209 Schizophrenia, unspecified: Secondary | ICD-10-CM | POA: Insufficient documentation

## 2018-02-11 DIAGNOSIS — F17290 Nicotine dependence, other tobacco product, uncomplicated: Secondary | ICD-10-CM | POA: Diagnosis not present

## 2018-02-11 DIAGNOSIS — D509 Iron deficiency anemia, unspecified: Secondary | ICD-10-CM | POA: Insufficient documentation

## 2018-02-11 DIAGNOSIS — K2950 Unspecified chronic gastritis without bleeding: Secondary | ICD-10-CM | POA: Diagnosis not present

## 2018-02-11 DIAGNOSIS — K259 Gastric ulcer, unspecified as acute or chronic, without hemorrhage or perforation: Secondary | ICD-10-CM | POA: Diagnosis not present

## 2018-02-11 DIAGNOSIS — E119 Type 2 diabetes mellitus without complications: Secondary | ICD-10-CM | POA: Insufficient documentation

## 2018-02-11 DIAGNOSIS — K644 Residual hemorrhoidal skin tags: Secondary | ICD-10-CM | POA: Insufficient documentation

## 2018-02-11 DIAGNOSIS — Q438 Other specified congenital malformations of intestine: Secondary | ICD-10-CM | POA: Diagnosis not present

## 2018-02-11 DIAGNOSIS — D649 Anemia, unspecified: Secondary | ICD-10-CM

## 2018-02-11 DIAGNOSIS — Q439 Congenital malformation of intestine, unspecified: Secondary | ICD-10-CM | POA: Diagnosis not present

## 2018-02-11 HISTORY — PX: POLYPECTOMY: SHX5525

## 2018-02-11 HISTORY — PX: ESOPHAGOGASTRODUODENOSCOPY (EGD) WITH PROPOFOL: SHX5813

## 2018-02-11 HISTORY — PX: BIOPSY: SHX5522

## 2018-02-11 HISTORY — PX: COLONOSCOPY WITH PROPOFOL: SHX5780

## 2018-02-11 LAB — GLUCOSE, CAPILLARY
Glucose-Capillary: 89 mg/dL (ref 65–99)
Glucose-Capillary: 75 mg/dL (ref 65–99)

## 2018-02-11 SURGERY — COLONOSCOPY WITH PROPOFOL
Anesthesia: Monitor Anesthesia Care

## 2018-02-11 MED ORDER — LACTATED RINGERS IV SOLN
INTRAVENOUS | Status: DC
  Administered 2018-02-11: 14:00:00 via INTRAVENOUS

## 2018-02-11 MED ORDER — LIDOCAINE VISCOUS 2 % MT SOLN
OROMUCOSAL | Status: DC | PRN
  Administered 2018-02-11: 6 mL via OROMUCOSAL

## 2018-02-11 MED ORDER — GLYCOPYRROLATE 0.2 MG/ML IJ SOLN
INTRAMUSCULAR | Status: DC | PRN
  Administered 2018-02-11: 0.2 mg via INTRAVENOUS

## 2018-02-11 MED ORDER — PROPOFOL 500 MG/50ML IV EMUL
INTRAVENOUS | Status: DC | PRN
  Administered 2018-02-11: 14:00:00 via INTRAVENOUS
  Administered 2018-02-11: 150 ug/kg/min via INTRAVENOUS

## 2018-02-11 MED ORDER — LIDOCAINE HCL (CARDIAC) 10 MG/ML IV SOLN
INTRAVENOUS | Status: DC | PRN
  Administered 2018-02-11: 30 mg via INTRAVENOUS

## 2018-02-11 MED ORDER — LIDOCAINE VISCOUS 2 % MT SOLN
OROMUCOSAL | Status: AC
  Filled 2018-02-11: qty 15

## 2018-02-11 MED ORDER — MIDAZOLAM HCL 5 MG/5ML IJ SOLN
INTRAMUSCULAR | Status: DC | PRN
  Administered 2018-02-11 (×2): 1 mg via INTRAVENOUS

## 2018-02-11 NOTE — Interval H&P Note (Signed)
History and Physical Interval Note:  02/11/2018 1:33 PM  Richard Abbott  has presented today for surgery, with the diagnosis of normocytic anemia  The various methods of treatment have been discussed with the patient and family. After consideration of risks, benefits and other options for treatment, the patient has consented to  Procedure(s) with comments: COLONOSCOPY WITH PROPOFOL (N/A) - 1:00pm ESOPHAGOGASTRODUODENOSCOPY (EGD) WITH PROPOFOL (N/A) as a surgical intervention .  The patient's history has been reviewed, patient examined, no change in status, stable for surgery.  I have reviewed the patient's chart and labs.  Questions were answered to the patient's satisfaction.     [employer]aton CorporationSandi Freida Nebel

## 2018-02-11 NOTE — Op Note (Signed)
[facility] Patient Name: Richard Abbott Procedure Date: 02/11/2018 1:50 PM MRN: [id number] Date of Birth: [birth year] Attending MD: Jonette Eva MD, MD CSN: [id number] Age: 52 Admit Type: Outpatient Procedure:                Colonoscopy WITH COLD FORCEPS POLYPECTOMY Indications:              Anemia Providers:                Jonette Eva MD, MD, Jannett Celestine, RN, Edythe Clarity,                            Technician Referring MD:             Aliene Beams Medicines:                Propofol per Anesthesia Complications:            No immediate complications. Estimated Blood Loss:     Estimated blood loss was minimal. Procedure:                Pre-Anesthesia Assessment:                           - Prior to the procedure, a History and Physical                            was performed, and patient medications and                            allergies were reviewed. The patient's tolerance of                            previous anesthesia was also reviewed. The risks                            and benefits of the procedure and the sedation                            options and risks were discussed with the patient.                            All questions were answered, and informed consent                            was obtained. Prior Anticoagulants: The patient has                            taken ibuprofen, last dose was 1 day prior to                            procedure. ASA Grade Assessment: II - A patient                            with mild systemic disease. After reviewing the  risks and benefits, the patient was deemed in                            satisfactory condition to undergo the procedure.                            After obtaining informed consent, the colonoscope                            was passed under direct vision. Throughout the                            procedure, the patient's blood pressure, pulse, and   oxygen saturations were monitored continuously. The                            [id number] ([id number]) scope was introduced through                            the anus and advanced to the 7 cm into the ileum.                            The colonoscopy was somewhat difficult due to a                            tortuous colon and the patient's body habitus.                            Successful completion of the procedure was aided by                            straightening and shortening the scope to obtain                            bowel loop reduction, performing chin lift and                            COLOWRAP. The patient tolerated the procedure well.                            The quality of the bowel preparation was excellent.                            The terminal ileum, ileocecal valve, appendiceal                            orifice, and rectum were photographed. Scope In: 2:07:46 PM Scope Out: 2:27:28 PM Scope Withdrawal Time: 0 hours 17 minutes 2 seconds  Total Procedure Duration: 0 hours 19 minutes 42 seconds  Findings:      The terminal ileum appeared normal.      A 2 mm polyp was found in the cecum. The polyp was sessile. The polyp       was removed with a cold biopsy forceps. Resection  and retrieval were       complete.      The recto-sigmoid colon, sigmoid colon and descending colon were       significantly tortuous.      External and internal hemorrhoids were found during retroflexion. The       hemorrhoids were moderate.      The recto-sigmoid colon, sigmoid colon and descending colon were       moderately redundant. Impression:               - The examined portion of the ileum was normal.                           - One 2 mm polyp in the cecum, removed with a cold                            biopsy forceps. Resected and retrieved.                           - Tortuous LEFT colon.                           - Internal hemorrhoids. Moderate Sedation:      Per Anesthesia  Care Recommendation:           - Repeat colonoscopy in 5-10 years for surveillance.                           - High fiber diet.                           - Continue present medications.                           - Await pathology results.                           - Patient has a contact number available for                            emergencies. The signs and symptoms of potential                            delayed complications were discussed with the                            patient. Return to normal activities tomorrow.                            Written discharge instructions were provided to the                            patient. Procedure Code(s):        --- Professional ---                           [id number], Colonoscopy, flexible; with biopsy, single  or multiple Diagnosis Code(s):        --- Professional ---                           K64.8, Other hemorrhoids                           D12.0, Benign neoplasm of cecum                           D64.9, Anemia, unspecified                           Q43.8, Other specified congenital malformations of                            intestine CPT copyright 2017 American Medical Association. All rights reserved. The codes documented in this report are preliminary and upon coder review may  be revised to meet current compliance requirements. Jonette Eva, MD Jonette Eva MD, MD 02/11/2018 3:01:16 PM This report has been signed electronically. Number of Addenda: 0

## 2018-02-11 NOTE — Transfer of Care (Signed)
Immediate Anesthesia Transfer of Care Note  Patient: Richard Abbott  Procedure(s) Performed: COLONOSCOPY WITH PROPOFOL (N/A ) ESOPHAGOGASTRODUODENOSCOPY (EGD) WITH PROPOFOL (N/A ) POLYPECTOMY BIOPSY  Patient Location: PACU  Anesthesia Type:MAC  Level of Consciousness: awake, alert , oriented and patient cooperative  Airway & Oxygen Therapy: Patient Spontanous Breathing  Post-op Assessment: Report given to RN and Post -op Vital signs reviewed and stable  Post vital signs: Reviewed and stable  Last Vitals:  Vitals Value Taken Time  BP 101/69 02/11/2018  2:52 PM  Temp    Pulse 82 02/11/2018  2:55 PM  Resp 17 02/11/2018  2:55 PM  SpO2 91 % 02/11/2018  2:55 PM  Vitals shown include unvalidated device data.  Last Pain:  Vitals:   02/11/18 1253  TempSrc: Oral  PainSc: 0-No pain      Patients Stated Pain Goal: 5 (84/12/82 0813)  Complications: No apparent anesthesia complications

## 2018-02-11 NOTE — Op Note (Signed)
[facility] Patient Name: Richard Abbott Procedure Date: 02/11/2018 2:29 PM MRN: [id number] Date of Birth: 1966-05-11 Attending MD: Jonette Eva MD, MD CSN: [id number] Age: 53 Admit Type: Outpatient Procedure:                Upper GI endoscopy WITH COLD FORCEPS BIOPSY Indications:              Anemia-USES IBUPROFEN Providers:                Jonette Eva MD, MD, Jannett Celestine, RN, Edythe Clarity,                            Technician Referring MD:             Aliene Beams Medicines:                Propofol per Anesthesia Complications:            No immediate complications. Estimated Blood Loss:     Estimated blood loss was minimal. Procedure:                Pre-Anesthesia Assessment:                           - Prior to the procedure, a History and Physical                            was performed, and patient medications and                            allergies were reviewed. The patient's tolerance of                            previous anesthesia was also reviewed. The risks                            and benefits of the procedure and the sedation                            options and risks were discussed with the patient.                            All questions were answered, and informed consent                            was obtained. Prior Anticoagulants: The patient has                            taken ibuprofen, last dose was 1 day prior to                            procedure. ASA Grade Assessment: II - A patient                            with mild systemic disease. After reviewing the  risks and benefits, the patient was deemed in                            satisfactory condition to undergo the procedure.                            After obtaining informed consent, the endoscope was                            passed under direct vision. Throughout the                            procedure, the patient's blood pressure, pulse, and            oxygen saturations were monitored continuously. The                            [id number] ([id number]) scope was introduced through the                            mouth, and advanced to the second part of duodenum.                            The upper GI endoscopy was somewhat difficult due                            to the patient's body habitus. Successful                            completion of the procedure was aided by performing                            chin lift. The patient tolerated the procedure well. Scope In: 2:34:32 PM Scope Out: 2:43:42 PM Total Procedure Duration: 0 hours 9 minutes 10 seconds  Findings:      The examined esophagus was normal.      Patchy mild inflammation characterized by congestion (edema) and       erythema was found in the gastric fundus and in the gastric antrum.       Biopsies were taken with a cold forceps for Helicobacter pylori       testing(3 ANTRUM, 2: BODY).      A medium-sized hiatal hernia was present.      The examined duodenum was normal. Biopsies for histology were taken with       a cold forceps for evaluation of celiac disease(2: BULB, 4: 2ND PORTION). Impression:               - FEW CAMERON'S EROSIONS                           - MILD Gastritis. Biopsied.                           - Medium-sized hiatal hernia.                           -  NO OBVIOUS SOURCE FOR ANEMIA IDENTIFIED, SUSPECT                            NSAID ENTEROPATHY OR CAMERON'S ULCERS. Moderate Sedation:      Per Anesthesia Care Recommendation:           - High fiber diet.                           - Continue present medications.                           - Await pathology results. NEEDS GIVENS CAPSULE IF                            NO SOURCE FOR ANEMIA IDENTIFIED.                           - Return to my office in 4 months.                           - Patient has a contact number available for                            emergencies. The signs and symptoms of  potential                            delayed complications were discussed with the                            patient. Return to normal activities tomorrow.                            Written discharge instructions were provided to the                            patient. Procedure Code(s):        --- Professional ---                           [phone], Esophagogastroduodenoscopy, flexible,                            transoral; with biopsy, single or multiple Diagnosis Code(s):        --- Professional ---                           K29.70, Gastritis, unspecified, without bleeding                           K44.9, Diaphragmatic hernia without obstruction or                            gangrene                           D64.9, Anemia, unspecified CPT copyright 2017  American Medical Association. All rights reserved. The codes documented in this report are preliminary and upon coder review may  be revised to meet current compliance requirements. Jonette EvaSandi Timmya Blazier, MD Jonette EvaSandi Angelyse Heslin MD, MD 02/11/2018 3:14:38 PM This report has been signed electronically. Number of Addenda: 0

## 2018-02-11 NOTE — Discharge Instructions (Addendum)
You have internal hemorrhoids, and HAD 1 small polyp removed. You have mild gastritis. I biopsied your stomach.   CONTINUE OMEPRAZOLE.  AVOID ITEMS THAT TRIGGER GASTRITIS.  FOLLOW A HIGH FIBER/LOW FAT DIET. AVOID ITEMS THAT CAUSE BLOATING.   YOUR BIOPSY RESULTS WILL BE AVAILABLE IN 7 DAYS.   Next colonoscopy in 5-10 years.   ENDOSCOPY Care After Read the instructions outlined below and refer to this sheet in the next week. These discharge instructions provide you with general information on caring for yourself after you leave the hospital. While your treatment has been planned according to the most current medical practices available, unavoidable complications occasionally occur. If you have any problems or questions after discharge, call DR. Kirill Chatterjee, [phone].  ACTIVITY  You may resume your regular activity, but move at a slower pace for the next 24 hours.   Take frequent rest periods for the next 24 hours.   Walking will help get rid of the air and reduce the bloated feeling in your belly (abdomen).   No driving for 24 hours (because of the medicine (anesthesia) used during the test).   You may shower.   Do not sign any important legal documents or operate any machinery for 24 hours (because of the anesthesia used during the test).    NUTRITION  Drink plenty of fluids.   You may resume your normal diet as instructed by your doctor.   Begin with a light meal and progress to your normal diet. Heavy or fried foods are harder to digest and may make you feel sick to your stomach (nauseated).   Avoid alcoholic beverages for 24 hours or as instructed.    MEDICATIONS  You may resume your normal medications.   WHAT YOU CAN EXPECT TODAY  Some feelings of bloating in the abdomen.   Passage of more gas than usual.   Spotting of blood in your stool or on the toilet paper  .  IF YOU HAD POLYPS REMOVED DURING THE ENDOSCOPY:  Eat a soft diet IF YOU HAVE NAUSEA,  BLOATING, ABDOMINAL PAIN, OR VOMITING.    FINDING OUT THE RESULTS OF YOUR TEST Not all test results are available during your visit. DR. Darrick PennaFIELDS WILL CALL YOU WITHIN 14 DAYS OF YOUR PROCEDUE WITH YOUR RESULTS. Do not assume everything is normal if you have not heard from DR. Avner Stroder, CALL HER OFFICE AT [phone].  SEEK IMMEDIATE MEDICAL ATTENTION AND CALL THE OFFICE: [phone] IF:  You have more than a spotting of blood in your stool.   Your belly is swollen (abdominal distention).   You are nauseated or vomiting.   You have a temperature over 101F.   You have abdominal pain or discomfort that is severe or gets worse throughout the day.  Polyps, Colon  A polyp is extra tissue that grows inside your body. Colon polyps grow in the large intestine. The large intestine, also called the colon, is part of your digestive system. It is a long, hollow tube at the end of your digestive tract where your body makes and stores stool. Most polyps are not dangerous. They are benign. This means they are not cancerous. But over time, some types of polyps can turn into cancer. Polyps that are smaller than a pea are usually not harmful. But larger polyps could someday become or may already be cancerous. To be safe, doctors remove all polyps and test them.    PREVENTION There is not one sure way to prevent polyps. You might be able  to lower your risk of getting them if you:  Eat more fruits and vegetables and less fatty food.   Do not smoke.   Avoid alcohol.   Exercise every day.   Lose weight if you are overweight.   Eating more calcium and folate can also lower your risk of getting polyps. Some foods that are rich in calcium are milk, cheese, and broccoli. Some foods that are rich in folate are chickpeas, kidney beans, and spinach.    Gastritis  Gastritis is an inflammation (the body's way of reacting to injury and/or infection) of the stomach. It is often caused by viral or bacterial  (germ) infections. It can also be caused BY ASPIRIN, BC/GOODY POWDER'S, (IBUPROFEN) MOTRIN, OR ALEVE (NAPROXEN), chemicals (including alcohol), SPICY FOODS, and medications. This illness may be associated with generalized malaise (feeling tired, not well), UPPER ABDOMINAL STOMACH cramps, and fever. One common bacterial cause of gastritis is an organism known as H. Pylori. This can be treated with antibiotics.    Hemorrhoids Hemorrhoids are dilated (enlarged) veins around the rectum. Sometimes clots will form in the veins. This makes them swollen and painful. These are called thrombosed hemorrhoids. Causes of hemorrhoids include:  Constipation.   Straining to have a bowel movement.  HEAVY LIFTING  HOME CARE INSTRUCTIONS  Eat a well balanced diet and drink 6 to 8 glasses of water every day to avoid constipation. You may also use a bulk laxative.   Avoid straining to have bowel movements.   Keep anal area dry and clean.   Do not use a donut shaped pillow or sit on the toilet for long periods. This increases blood pooling and pain.   Move your bowels when your body has the urge; this will require less straining and will decrease pain and pressure.

## 2018-02-11 NOTE — Anesthesia Preprocedure Evaluation (Signed)
Anesthesia Evaluation  Patient identified by MRN, date of birth, ID band Patient awake    Reviewed: Allergy & Precautions, H&P , NPO status , Patient's Chart, lab work & pertinent test results, reviewed documented beta blocker date and time   Airway Mallampati: III  TM Distance: >3 FB Neck ROM: full    Dental no notable dental hx. (+) Teeth Intact   Pulmonary neg pulmonary ROS, Current Smoker,    Pulmonary exam normal breath sounds clear to auscultation       Cardiovascular Exercise Tolerance: Good negative cardio ROS Normal cardiovascular exam Rhythm:regular Rate:Normal     Neuro/Psych negative neurological ROS  negative psych ROS   GI/Hepatic negative GI ROS, Neg liver ROS,   Endo/Other  negative endocrine ROSdiabetes  Renal/GU negative Renal ROS  negative genitourinary   Musculoskeletal   Abdominal   Peds  Hematology negative hematology ROS (+)   Anesthesia Other Findings No clinical complaints  Reproductive/Obstetrics negative OB ROS                             Anesthesia Physical Anesthesia Plan  ASA: II  Anesthesia Plan: MAC   Post-op Pain Management:    Induction:   PONV Risk Score and Plan:   Airway Management Planned:   Additional Equipment:   Intra-op Plan:   Post-operative Plan:   Informed Consent: I have reviewed the patients History and Physical, chart, labs and discussed the procedure including the risks, benefits and alternatives for the proposed anesthesia with the patient or authorized representative who has indicated his/her understanding and acceptance.   Dental Advisory Given  Plan Discussed with: CRNA  Anesthesia Plan Comments:         Anesthesia Quick Evaluation

## 2018-02-11 NOTE — Anesthesia Postprocedure Evaluation (Signed)
Anesthesia Post Note  Patient: Bartholomew Ramesh  Procedure(s) Performed: COLONOSCOPY WITH PROPOFOL (N/A ) ESOPHAGOGASTRODUODENOSCOPY (EGD) WITH PROPOFOL (N/A ) POLYPECTOMY BIOPSY  Patient location during evaluation: PACU Anesthesia Type: MAC Level of consciousness: awake and alert, oriented and patient cooperative Pain management: pain level controlled Vital Signs Assessment: post-procedure vital signs reviewed and stable Respiratory status: spontaneous breathing and respiratory function stable Cardiovascular status: stable Postop Assessment: no apparent nausea or vomiting Anesthetic complications: no     Last Vitals:  Vitals:   02/11/18 1253  BP: 114/79  Pulse: 72  Resp: 18  Temp: 36.6 C  SpO2: 94%    Last Pain:  Vitals:   02/11/18 1253  TempSrc: Oral  PainSc: 0-No pain                 Jaquelin Meaney A

## 2018-02-11 NOTE — Anesthesia Procedure Notes (Signed)
Procedure Name: MAC Date/Time: 02/11/2018 1:51 PM Performed by: Andree Elk Amy A, CRNA Pre-anesthesia Checklist: Patient identified, Emergency Drugs available, Suction available, Patient being monitored and Timeout performed Oxygen Delivery Method: Simple face mask

## 2018-02-13 ENCOUNTER — Encounter: Payer: Self-pay | Admitting: *Deleted

## 2018-02-13 ENCOUNTER — Ambulatory Visit: Payer: Medicaid Other | Admitting: Nutrition

## 2018-02-13 DIAGNOSIS — R9431 Abnormal electrocardiogram [ECG] [EKG]: Secondary | ICD-10-CM

## 2018-02-13 NOTE — Progress Notes (Signed)
I called the facility and spoke to Robin. She said OK to make referral to cardiology.

## 2018-02-17 ENCOUNTER — Encounter: Payer: Self-pay | Admitting: Cardiology

## 2018-02-17 ENCOUNTER — Other Ambulatory Visit: Payer: Self-pay

## 2018-02-17 DIAGNOSIS — D649 Anemia, unspecified: Secondary | ICD-10-CM

## 2018-02-17 NOTE — Progress Notes (Signed)
Cardiology Office Note  Date: 02/18/2018   ID: Richard Abbott, DOB 10-21-1966, MRN [id number]  PCP: Aliene Beams, MD  Consulting Cardiologist: Nona Dell, MD   Chief Complaint  Patient presents with  . Abnormal ECG    History of Present Illness: Richard Abbott is a 52 y.o. male referred for cardiology consultation by Dr. Darrick Penna due to abnormal ECG.  Tracing in question reviewed by Dr. Darrick Penna was from April 11.  I personally reviewed this tracing which shows sinus rhythm with LVH and inferolateral repolarization abnormalities, not specifically ischemic changes which was reported.  There are no old tracings available for comparison.  Patient lives in a group home in [place], [facility].  He is here today with assistance.  He tells me that he generally feels well, has no chest pain or shortness of breath, no palpitations or syncope.  He states that he goes to the [facility] from his facility once a week, exercises with weights, and also walks on the treadmill without limitation.  Patient underwent recent EGD and colonoscopy without complication.  I reviewed his medications which are outlined below.  Past Medical History:  Diagnosis Date  . Anxiety   . Bipolar disorder (HCC)   . GERD (gastroesophageal reflux disease)   . Hypercholesteremia   . Schizophrenia (HCC)   . Type 2 diabetes mellitus (HCC)     Past Surgical History:  Procedure Laterality Date  . BIOPSY  02/11/2018   Procedure: BIOPSY;  Surgeon: West Bali, MD;  [facility];  Service: Endoscopy;;  duodenal biopsy and gastric biopsy  . COLONOSCOPY WITH PROPOFOL N/A 02/11/2018   Procedure: COLONOSCOPY WITH PROPOFOL;  Surgeon: West Bali, MD;  [facility];  Service: Endoscopy;  Laterality: N/A;  1:00pm  . ESOPHAGOGASTRODUODENOSCOPY (EGD) WITH PROPOFOL N/A 02/11/2018   Procedure: ESOPHAGOGASTRODUODENOSCOPY (EGD) WITH PROPOFOL;  Surgeon: West Bali, MD;  [facility];  Service: Endoscopy;  Laterality: N/A;  . LACERATION REPAIR Right    Arm  . POLYPECTOMY  02/11/2018   Procedure: POLYPECTOMY;  Surgeon: West Bali, MD;  [facility];  Service: Endoscopy;;  cecal polyp cb,    Current Outpatient Medications  Medication Sig Dispense Refill  . atorvastatin (LIPITOR) 20 MG tablet Take 1 tablet (20 mg total) by mouth daily. 90 tablet 3  . benztropine (COGENTIN) 1 MG tablet Take 1 mg by mouth 3 (three) times daily.    . benztropine (COGENTIN) 2 MG tablet Take 2 mg by mouth every 8 (eight) hours as needed (EPS).     . Cholecalciferol (D3-1000) 1000 units capsule Take 1 capsule (1,000 Units total) by mouth daily. 90 capsule 1  . DIABETIC TUSSIN EX 100 MG/5ML liquid TAKE 10 MLS BY MOUTH 3 TIMES DAILY AS NEEDED FOR COUCH. 118 mL 11  . divalproex (DEPAKOTE) 500 MG DR tablet Take 500 mg by mouth every morning.    . divalproex (DEPAKOTE) 500 MG DR tablet Take 1,000 mg by mouth at bedtime.     . docusate sodium (COLACE) 100 MG capsule Take 1 capsule (100 mg total) by mouth daily. 90 capsule 1  . haloperidol lactate (HALDOL) 5 MG/ML injection Inject 200 mg into the muscle See admin instructions. Every 4 weeks     . ibuprofen (ADVIL,MOTRIN) 200 MG tablet TAKE 2 TABLETS (400MG ) BY MOUTH EVERY 8 HOURS AS NEEDED FOR HEADACHE OR MILD PAIN 60 tablet 11  . omeprazole (PRILOSEC) 20 MG capsule Take 1  capsule (20 mg total) by mouth daily. 90 capsule 1  . sertraline (ZOLOFT) 100 MG tablet Take 100 mg by mouth every morning.    . Skin Protectants, Misc. (EUCERIN) cream Apply topically as needed for dry skin. 454 g 2  . Sunscreens (BLISTEX LIP BALM) 2-2.5-6.6 % STCK Apply to lips prn for chap/dry lips 1 each 11   No current facility-administered medications for this visit.    Allergies:  Chlorpromazine   Social History: The patient  reports that he has been smoking cigarettes and cigars.  He has a 5.00 pack-year smoking history. He has never used smokeless  tobacco. He reports that he does not drink alcohol or use drugs.   Family History: The patient's family history is not on file.   ROS:  Please see the history of present illness. Otherwise, complete review of systems is positive for occasional reflux.  All other systems are reviewed and negative.   Physical Exam: VS:  BP 126/84   Pulse 80   Ht 5\' 5"  (1.651 m)   Wt 223 lb (101.2 kg)   BMI 37.11 kg/m , BMI Body mass index is 37.11 kg/m.  Wt Readings from Last 3 Encounters:  02/18/18 223 lb (101.2 kg)  02/06/18 222 lb (100.7 kg)  02/04/18 220 lb (99.8 kg)    General: Patient appears comfortable at rest. HEENT: Conjunctiva and lids normal, oropharynx clear. Neck: Supple, no elevated JVP or carotid bruits, no thyromegaly. Lungs: Clear to auscultation, nonlabored breathing at rest. Cardiac: Regular rate and rhythm, no S3, soft systolic murmur, no pericardial rub. Abdomen: Soft, nontender, bowel sounds present. Extremities: No pitting edema, distal pulses 2+. Skin: Warm and dry. Musculoskeletal: No kyphosis.  Good muscle tone and strength. Neuropsychiatric: Alert and oriented x3, affect grossly appropriate.  ECG: There are no old tracings available for comparison.  Recent Labwork: 11/14/2017: TSH 1.39 02/06/2018: BUN 13; Creatinine, Ser 0.87; Hemoglobin 12.5; Platelets 329; Potassium 3.9; Sodium 140     Component Value Date/Time   CHOL 133 12/05/2017 1252   TRIG 167 (H) 12/05/2017 1252   HDL 26 (L) 12/05/2017 1252   CHOLHDL 5.1 (H) 12/05/2017 1252   LDLCALC 80 12/05/2017 1252    Other Studies Reviewed Today:  Chest x-ray 11/14/2017: IMPRESSION: No evidence of residual pneumonia.  Probable pulmonary vessels on end resulting in nodular densities in the right mid lung and left perihilar region. However, the nature of abnormalities on a previous outside chest x-ray are not known to me. Comparison of the findings today with the previous chest x-ray findings would be useful,  and I will gladly performed such a comparison if the images can be provided. Further evaluation with chest CT scanning is recommended if the previous studies cannot be obtained for comparison.  Assessment and Plan:  1.  Abnormal ECG suggesting at least mild LVH with associated repolarization changes.  Less likely to represent ischemia in the absence of symptoms.  There are no old tracings for comparison.  He states that he exercises at the [facility] as discussed above without symptoms or specific limitation.  He has a very soft cardiac murmur which is likely benign.  We will obtain an echocardiogram for basic structural assessment, but would not anticipate further cardiac testing unless significant abnormalities are discovered.  2.  History of hyperlipidemia, followed by Dr. Tracie Harrier on Lipitor.  Recent LDL 80.  3.  Tobacco abuse.  Smoking cessation would be beneficial.  4.  Type 2 diabetes mellitus, followed by Dr. Tracie Harrier.  Recent hemoglobin A1c 6.2.  Current medicines were reviewed with the patient today.   Orders Placed This Encounter  Procedures  . ECHOCARDIOGRAM COMPLETE    Disposition: Call with test results.  Signed, Jonelle SidleSamuel G. McDowell, MD, [facility] 02/18/2018 10:50 AM    [facility] [address], [place], [place] [address] Phone: [phone]; Fax: [phone]

## 2018-02-17 NOTE — Progress Notes (Signed)
Richard Abbott, administrator, is aware of results and plan.

## 2018-02-17 NOTE — Progress Notes (Signed)
Lab orders on file for August 2019.

## 2018-02-18 ENCOUNTER — Telehealth: Payer: Self-pay | Admitting: Cardiology

## 2018-02-18 ENCOUNTER — Encounter: Payer: Self-pay | Admitting: *Deleted

## 2018-02-18 ENCOUNTER — Ambulatory Visit (INDEPENDENT_AMBULATORY_CARE_PROVIDER_SITE_OTHER): Payer: Medicaid Other | Admitting: Cardiology

## 2018-02-18 ENCOUNTER — Encounter: Payer: Self-pay | Admitting: Cardiology

## 2018-02-18 VITALS — BP 126/84 | HR 80 | Ht 65.0 in | Wt 223.0 lb

## 2018-02-18 DIAGNOSIS — E782 Mixed hyperlipidemia: Secondary | ICD-10-CM

## 2018-02-18 DIAGNOSIS — R9431 Abnormal electrocardiogram [ECG] [EKG]: Secondary | ICD-10-CM | POA: Diagnosis not present

## 2018-02-18 DIAGNOSIS — R011 Cardiac murmur, unspecified: Secondary | ICD-10-CM | POA: Diagnosis not present

## 2018-02-18 DIAGNOSIS — Z720 Tobacco use: Secondary | ICD-10-CM | POA: Diagnosis not present

## 2018-02-18 NOTE — Telephone Encounter (Signed)
[facility]

## 2018-02-18 NOTE — Patient Instructions (Signed)
Medication Instructions:  Your physician recommends that you continue on your current medications as directed. Please refer to the Current Medication list given to you today.   Labwork: NONE  Testing/Procedures: Your physician has requested that you have an echocardiogram. Echocardiography is a painless test that uses sound waves to create images of your heart. It provides your doctor with information about the size and shape of your heart and how well your heart's chambers and valves are working. This procedure takes approximately one hour. There are no restrictions for this procedure.    Follow-Up: Your physician recommends that you schedule a follow-up appointment in: PENDING TEST RESULTS- WE WILL CALL WITH RESULTS   Any Other Special Instructions Will Be Listed Below (If Applicable).     If you need a refill on your cardiac medications before your next appointment, please call your pharmacy.

## 2018-03-13 ENCOUNTER — Other Ambulatory Visit: Payer: Self-pay

## 2018-03-13 ENCOUNTER — Ambulatory Visit (INDEPENDENT_AMBULATORY_CARE_PROVIDER_SITE_OTHER): Payer: Medicaid Other

## 2018-03-13 DIAGNOSIS — R011 Cardiac murmur, unspecified: Secondary | ICD-10-CM | POA: Diagnosis not present

## 2018-03-17 ENCOUNTER — Telehealth: Payer: Self-pay | Admitting: *Deleted

## 2018-03-17 NOTE — Telephone Encounter (Signed)
Brandt Loosen (caregiver) informed.

## 2018-03-17 NOTE — Telephone Encounter (Signed)
-----   Message from Jonelle Sidle, MD sent at 03/16/2018  2:29 PM EDT ----- Results reviewed. Normal LVEF at 55-60%, normal LV wall thickness without wall motion abnormalities, no significant valvular abnormalities. No further cardiac testing anticipated. Keep follow-up with PCP. A copy of this test should be forwarded to Aliene Beams, MD.

## 2018-03-27 ENCOUNTER — Other Ambulatory Visit: Payer: Self-pay

## 2018-03-27 DIAGNOSIS — E1169 Type 2 diabetes mellitus with other specified complication: Secondary | ICD-10-CM

## 2018-03-27 DIAGNOSIS — E785 Hyperlipidemia, unspecified: Principal | ICD-10-CM

## 2018-03-27 MED ORDER — ATORVASTATIN CALCIUM 20 MG PO TABS: 20.0000 mg | ORAL_TABLET | Freq: Every day | ORAL | 0 refills | Status: AC

## 2018-04-04 ENCOUNTER — Encounter: Payer: Self-pay | Admitting: Family Medicine

## 2018-04-22 ENCOUNTER — Other Ambulatory Visit: Payer: Self-pay | Admitting: Family Medicine

## 2018-04-22 DIAGNOSIS — K219 Gastro-esophageal reflux disease without esophagitis: Secondary | ICD-10-CM

## 2018-04-30 ENCOUNTER — Other Ambulatory Visit: Payer: Self-pay

## 2018-04-30 ENCOUNTER — Encounter: Payer: Self-pay | Admitting: Gastroenterology

## 2018-04-30 ENCOUNTER — Telehealth: Payer: Self-pay

## 2018-04-30 DIAGNOSIS — E559 Vitamin D deficiency, unspecified: Secondary | ICD-10-CM

## 2018-04-30 MED ORDER — CHOLECALCIFEROL 25 MCG (1000 UT) PO CAPS: 1000.0000 [IU] | ORAL_CAPSULE | Freq: Every day | ORAL | 0 refills | Status: DC

## 2018-04-30 NOTE — Telephone Encounter (Signed)
Representative from group home called and asked about FL2 form that was to be completed last Friday. Explained that due to a family emergency Dr.Hagler has been out of the office all week, but I would send a message. Our office would be closed tomorrow and Friday. Reminded her of patient's appointment on July 9th with verbal understanding.

## 2018-05-05 NOTE — Telephone Encounter (Signed)
Spoke with Richard Abbott and advised her of Dr.Hagler's response with verbal understanding. She will send a new FL2 paper to be completed at tomorrows visit (May 06, 2018)

## 2018-05-05 NOTE — Telephone Encounter (Signed)
We will complete the paperwork at the office visit. Advise that needs OV for all FL2 paperwork. Janine Limboachel H. Tracie HarrierHagler, MD

## 2018-05-06 ENCOUNTER — Ambulatory Visit (INDEPENDENT_AMBULATORY_CARE_PROVIDER_SITE_OTHER): Payer: Medicaid Other | Admitting: Family Medicine

## 2018-05-06 ENCOUNTER — Encounter: Payer: Self-pay | Admitting: Gastroenterology

## 2018-05-06 ENCOUNTER — Other Ambulatory Visit: Payer: Self-pay

## 2018-05-06 ENCOUNTER — Encounter: Payer: Self-pay | Admitting: Family Medicine

## 2018-05-06 VITALS — BP 114/70 | HR 84 | Temp 98.7°F | Resp 14 | Ht 65.0 in | Wt 222.1 lb

## 2018-05-06 DIAGNOSIS — F71 Moderate intellectual disabilities: Secondary | ICD-10-CM | POA: Diagnosis not present

## 2018-05-06 DIAGNOSIS — Z79899 Other long term (current) drug therapy: Secondary | ICD-10-CM | POA: Diagnosis not present

## 2018-05-06 DIAGNOSIS — K644 Residual hemorrhoidal skin tags: Secondary | ICD-10-CM | POA: Diagnosis not present

## 2018-05-06 DIAGNOSIS — E559 Vitamin D deficiency, unspecified: Secondary | ICD-10-CM | POA: Diagnosis not present

## 2018-05-06 DIAGNOSIS — E1169 Type 2 diabetes mellitus with other specified complication: Secondary | ICD-10-CM | POA: Diagnosis not present

## 2018-05-06 DIAGNOSIS — E785 Hyperlipidemia, unspecified: Secondary | ICD-10-CM

## 2018-05-06 DIAGNOSIS — F09 Unspecified mental disorder due to known physiological condition: Secondary | ICD-10-CM | POA: Diagnosis not present

## 2018-05-06 DIAGNOSIS — Z720 Tobacco use: Secondary | ICD-10-CM | POA: Diagnosis not present

## 2018-05-06 MED ORDER — WITCH HAZEL-GLYCERIN EX PADS: 1.0000 "application " | MEDICATED_PAD | CUTANEOUS | 12 refills | Status: DC | PRN

## 2018-05-06 MED ORDER — HYDROCORTISONE 2.5 % RE CREA: 1.0000 "application " | TOPICAL_CREAM | Freq: Two times a day (BID) | RECTAL | 0 refills | Status: DC | PRN

## 2018-05-06 NOTE — Progress Notes (Signed)
Patient ID: Richard Abbott, male    DOB: [birth year], 52 y.o.   MRN: [id number]  Chief Complaint  Patient presents with  . Diabetes    follow up  . Obesity    Allergies Chlorpromazine  Subjective:   Richard Abbott is a 52 y.o. male who presents to [facility] today.  HPI [name] presents today for follow-up visit.  He is accompanied by the group home administrator.  He lives in [facility].  He comes in today to get his [place] to paperwork completed.  He is followed by Dr. Geanie Cooley and psychiatry.  He has a history of schizophrenia, alcohol use disorder, developmental disabilities and cognitive/intellectual problems.  He reports that he has been doing well.  He does not have any complaints.  The group home administrator,Sierra, reports that they were told by his day program coordinator that he had complained of some red blood when he was wiping his bottom.  This happened several weeks ago when he is not having this happen since.  He denies any abdominal pain.  Denies any fever, chills, nausea, vomiting, diarrhea.  They report that he has regular bowel movements and does not have constipation or straining.  Patient reports that he feels like after he goes to the bathroom that he needs to wipe his bottom multiple times.  He reports that he feels like if he does not clean very well after going to the bathroom that bugs will get in his shorts and cause him problems.  He does have a history of external and internal hemorrhoids as evidenced by recent colonoscopy.  His colonoscopy was less than 3 months ago by Dr. Jonette Eva.  He has not had any medication for the hemorrhoids.  He does not want to be checked for this today.  He is also here with the group home administrator to discuss his smoking.  They report that over the past month he smoked greater than 11 packs of cigarettes.  Group home administrator reports that he smokes at least one cigarette every hour.  Patient  reports that he knows that it is not good for his health but he enjoys it.  He does not feel that he can go more than 2 hours without smoking at this time.  He would be willing to try to cut down.  He has a history of diabetes, diet controlled.  He has been getting somewhat of a modified diabetic diet at the group home.  They would like an order for diabetic diet.  He does not do much exercise.  He likes lifting some weights.  He does not do any cardiovascular exercise on a daily basis.  He denies any chest pain, shortness of breath, palpitations, lower extremity edema.  He denies any reflux or dyspepsia.  He does not have any issues with disorientation.  The group home administrator reports that he does not have any behavior that is verbally abusive to himself or others.  He does not require any personal care assistance.  However, they report that it takes him about 3 hours to shower and get ready in the mornings.  He has no functional limitations with sight, hearing, or speech.  He is active and social settings.  He is continent of bowel and bladder.  He has no skin breakdown.  He has no respiratory needs.  He reports his appetite is good.     Past Medical History:  Diagnosis Date  . Anxiety   . Bipolar  disorder (HCC)   . GERD (gastroesophageal reflux disease)   . Hypercholesteremia   . Schizophrenia (HCC)   . Type 2 diabetes mellitus (HCC)     Past Surgical History:  Procedure Laterality Date  . BIOPSY  02/11/2018   Procedure: BIOPSY;  Surgeon: West BaliFields, Sandi L, MD;  [facility];  Service: Endoscopy;;  duodenal biopsy and gastric biopsy  . COLONOSCOPY WITH PROPOFOL N/A 02/11/2018   Procedure: COLONOSCOPY WITH PROPOFOL;  Surgeon: West BaliFields, Sandi L, MD;  [facility];  Service: Endoscopy;  Laterality: N/A;  1:00pm  . ESOPHAGOGASTRODUODENOSCOPY (EGD) WITH PROPOFOL N/A 02/11/2018   Procedure: ESOPHAGOGASTRODUODENOSCOPY (EGD) WITH PROPOFOL;  Surgeon: West BaliFields, Sandi L, MD;   [facility];  Service: Endoscopy;  Laterality: N/A;  . LACERATION REPAIR Right    Arm  . POLYPECTOMY  02/11/2018   Procedure: POLYPECTOMY;  Surgeon: West BaliFields, Sandi L, MD;  [facility];  Service: Endoscopy;;  cecal polyp cb,    Family History  Problem Relation Age of Onset  . Colon cancer Neg Hx   . Colon polyps Neg Hx      Social History   Socioeconomic History  . Marital status: Single    Spouse name: Not on file  . Number of children: Not on file  . Years of education: Not on file  . Highest education level: Not on file  Occupational History  . Not on file  Social Needs  . Financial resource strain: Not on file  . Food insecurity:    Worry: Not on file    Inability: Not on file  . Transportation needs:    Medical: Not on file    Non-medical: Not on file  Tobacco Use  . Smoking status: Current Every Day Smoker    Packs/day: 0.25    Years: 20.00    Pack years: 5.00    Types: Cigarettes, Cigars  . Smokeless tobacco: Never Used  . Tobacco comment: Patient unsure of date where he began smoking but has been smoking for many years.  Substance and Sexual Activity  . Alcohol use: No    Frequency: Never    Comment: Prior history of alcohol abuse  . Drug use: No    Comment: Prior history of drug abuse  . Sexual activity: Not Currently    Partners: Male    Comment: Denies that he is currently sexually active  Lifestyle  . Physical activity:    Days per week: Not on file    Minutes per session: Not on file  . Stress: Not on file  Relationships  . Social connections:    Talks on phone: Not on file    Gets together: Not on file    Attends religious service: Not on file    Active member of club or organization: Not on file    Attends meetings of clubs or organizations: Not on file    Relationship status: Not on file  Other Topics Concern  . Not on file  Social History Narrative   [place] in  [place], [place]    Reports that he has a big family.      Has one brother and five sisters.   Father died. Mother living.  Does not have contact with his family.   Wants to move back to [place].      M[facility] in group home [facility].    Was [facility] [place] 3 years after being incarcerated for  drugs/alcohol/burgularly.    DSS is Guardian.        Review of Systems  Constitutional: Negative for appetite change, chills, fever and unexpected weight change.  HENT: Negative for trouble swallowing and voice change.   Eyes: Negative for visual disturbance.  Respiratory: Negative for cough, chest tightness, shortness of breath and wheezing.   Cardiovascular: Negative for chest pain, palpitations and leg swelling.  Gastrointestinal: Positive for anal bleeding. Negative for abdominal pain, diarrhea, nausea and vomiting.       Some blood with aggressive wiping several weeks ago.  None since then.  Patient defers rectal exam today.  Genitourinary: Negative for decreased urine volume, dysuria, frequency, hematuria and testicular pain.  Skin: Negative for rash.  Neurological: Negative for dizziness, tremors, syncope, facial asymmetry, weakness and headaches.  Hematological: Negative for adenopathy. Does not bruise/bleed easily.  Psychiatric/Behavioral: Negative for behavioral problems and dysphoric mood. The patient is not nervous/anxious.      Objective:   BP 114/70 (BP Location: Left Arm, Patient Position: Sitting, Cuff Size: Large)   Pulse 84   Temp 98.7 F (37.1 C) (Temporal)   Resp 14   Ht 5\' 5"  (1.651 m)   Wt 222 lb 1.3 oz (100.7 kg)   SpO2 92%   BMI 36.96 kg/m   Physical Exam  Constitutional: He is oriented to person, place, and time. He appears well-developed and well-nourished.  Abdominal obesity.  HENT:  Head: Normocephalic and atraumatic.  Eyes: Pupils are equal, round, and reactive to light. EOM are normal.  Neck: Normal range of motion. Neck supple. No thyromegaly present.  Cardiovascular:  Normal rate, regular rhythm and normal heart sounds.  Pulmonary/Chest: Effort normal and breath sounds normal.  Abdominal: Soft. Bowel sounds are normal.  Musculoskeletal: He exhibits no edema.  Neurological: He is alert and oriented to person, place, and time. No cranial nerve deficit.  Skin: Skin is warm, dry and intact.  Psychiatric:  Patient has labile mood and affect.  Behavior not quite within the range of normal.  Judgment and appropriate.   decreased cognitive function.  Limited insight into conditions.  Vitals reviewed.    Assessment and Plan  1. External hemorrhoids She does not want examination today.  Will prescribe Anusol to be used as needed and will get Tucks pads after bowel movements.  Patient was counseled against aggressive wiping and cleaning rituals.  I do believe that his insight and judgment is somewhat limited.  I believe that this could be aggravating his external hemorrhoids.  He did have a recent colonoscopy.  He has had no subsequent episodes.  Patient will follow-up with GI. - hydrocortisone (ANUSOL-HC) 2.5 % rectal cream; Place 1 application rectally 2 (two) times daily as needed for hemorrhoids or anal itching.  Dispense: 30 g; Refill: 0 - witch hazel-glycerin (TUCKS) pad; Apply 1 application topically as needed for itching.  Dispense: 40 each; Refill: 12 - Ambulatory referral to Gastroenterology  2. Hyperlipidemia associated with type 2 diabetes mellitus (HCC) Check cholesterol at this time.  Will place on diabetic and heart healthy diet.  3. Cognitive dysfunction Patient with decreased cognitive function and intellectual disabilities, further exacerbated secondary to his mood disorder.  Continue group home setting.  4. Moderate intellectual disabilities  5. High risk medication use  - Hepatic function panel  6. Vitamin D deficiency - VITAMIN D 25 Hydroxy (Vit-D Deficiency, Fractures)  7. Type 2 diabetes mellitus with other specified complication,  without long-term current use of insulin (HCC) Diet modifications recommended.  30 minutes of exercise recommended 5 days a week. - Hemoglobin A1c  Tobacco cessation was discussed with patient and group home administrators at length today.  At this time patient is agreeable to decreasing his cigarette smoking to 1 cigarette every 2 hours.  He has monitored during these smoke breaks.  He is not interested in completely quitting smoking and reports that he receives great enjoyment from smoking.  He was counseled regarding the health risks of smoking. Return in about 2 months (around 07/07/2018) for follow up. Aliene Beams, MD 05/06/2018

## 2018-05-06 NOTE — Patient Instructions (Signed)
Tobacco Cessation:   Plan is to decrease cigarette use to one cigarette every 2 hours. Continue to decrease cigarette use.    Steps to Quit Smoking Smoking tobacco can be bad for your health. It can also affect almost every organ in your body. Smoking puts you and people around you at risk for many serious long-lasting (chronic) diseases. Quitting smoking is hard, but it is one of the best things that you can do for your health. It is never too late to quit. What are the benefits of quitting smoking? When you quit smoking, you lower your risk for getting serious diseases and conditions. They can include:  Lung cancer or lung disease.  Heart disease.  Stroke.  Heart attack.  Not being able to have children (infertility).  Weak bones (osteoporosis) and broken bones (fractures).  If you have coughing, wheezing, and shortness of breath, those symptoms may get better when you quit. You may also get sick less often. If you are pregnant, quitting smoking can help to lower your chances of having a baby of low birth weight. What can I do to help me quit smoking? Talk with your doctor about what can help you quit smoking. Some things you can do (strategies) include:  Quitting smoking totally, instead of slowly cutting back how much you smoke over a period of time.  Going to in-person counseling. You are more likely to quit if you go to many counseling sessions.  Using resources and support systems, such as: ? Agricultural engineernline chats with a Veterinary surgeoncounselor. ? Phone quitlines. ? Automotive engineerrinted self-help materials. ? Support groups or group counseling. ? Text messaging programs. ? Mobile phone apps or applications.  Taking medicines. Some of these medicines may have nicotine in them. If you are pregnant or breastfeeding, do not take any medicines to quit smoking unless your doctor says it is okay. Talk with your doctor about counseling or other things that can help you.  Talk with your doctor about using more  than one strategy at the same time, such as taking medicines while you are also going to in-person counseling. This can help make quitting easier. What things can I do to make it easier to quit? Quitting smoking might feel very hard at first, but there is a lot that you can do to make it easier. Take these steps:  Talk to your family and friends. Ask them to support and encourage you.  Call phone quitlines, reach out to support groups, or work with a Veterinary surgeoncounselor.  Ask people who smoke to not smoke around you.  Avoid places that make you want (trigger) to smoke, such as: ? Bars. ? Parties. ? Smoke-break areas at work.  Spend time with people who do not smoke.  Lower the stress in your life. Stress can make you want to smoke. Try these things to help your stress: ? Getting regular exercise. ? Deep-breathing exercises. ? Yoga. ? Meditating. ? Doing a body scan. To do this, close your eyes, focus on one area of your body at a time from head to toe, and notice which parts of your body are tense. Try to relax the muscles in those areas.  Download or buy apps on your mobile phone or tablet that can help you stick to your quit plan. There are many free apps, such as QuitGuide from the [employer] developer(Centers for Disease Control and Prevention). You can find more support from smokefree.gov and other websites.  This information is not intended to replace advice given  to you by your health care provider. Make sure you discuss any questions you have with your health care provider. Document Released: 08/11/2009 Document Revised: 06/12/2016 Document Reviewed: 03/01/2015 Elsevier Interactive Patient Education  2018 [employer].

## 2018-05-21 ENCOUNTER — Other Ambulatory Visit: Payer: Self-pay

## 2018-05-21 DIAGNOSIS — D649 Anemia, unspecified: Secondary | ICD-10-CM

## 2018-06-04 ENCOUNTER — Ambulatory Visit: Payer: Medicaid Other | Admitting: Podiatry

## 2018-06-17 ENCOUNTER — Telehealth: Payer: Self-pay | Admitting: Family Medicine

## 2018-06-17 NOTE — Telephone Encounter (Signed)
New Message  MoldovaSierra the house supervisor from [facility] is wanting to speak to someone in regards to this patient.  Please f/u

## 2018-06-18 NOTE — Telephone Encounter (Signed)
Spoke with MoldovaSierra and she stated that when patient went to lab to have labs drawn, they stated he owed a past due bill, and this would have to be paid before they could draw his labs. Advised the patient to call the billing department and update or give them the rest of his insurance information so they could re-run it. Then after that is taken care of she should be able to go to the lab and have them drawn. She verbalized understanding.

## 2018-06-27 LAB — HEMOGLOBIN A1C
HEMOGLOBIN A1C: 6.4 %{Hb} — AB (ref <5.7)
MEAN PLASMA GLUCOSE: 137 (calc)
eAG (mmol/L): 7.6 (calc)

## 2018-06-27 LAB — HEPATIC FUNCTION PANEL
AG Ratio: 1.4 (calc) (ref 1.0–2.5)
ALBUMIN MSPROF: 4.2 g/dL (ref 3.6–5.1)
ALT: 21 U/L (ref 9–46)
AST: 25 U/L (ref 10–35)
Alkaline phosphatase (APISO): 65 U/L (ref 40–115)
BILIRUBIN TOTAL: 0.2 mg/dL (ref 0.2–1.2)
Bilirubin, Direct: 0.1 mg/dL (ref 0.0–0.2)
Globulin: 2.9 g/dL (calc) (ref 1.9–3.7)
Indirect Bilirubin: 0.1 mg/dL (calc) — ABNORMAL LOW (ref 0.2–1.2)
Total Protein: 7.1 g/dL (ref 6.1–8.1)

## 2018-06-27 LAB — VITAMIN D 25 HYDROXY (VIT D DEFICIENCY, FRACTURES): VIT D 25 HYDROXY: 53 ng/mL (ref 30–100)

## 2018-07-08 ENCOUNTER — Ambulatory Visit: Payer: Medicaid Other | Admitting: Family Medicine

## 2018-07-17 ENCOUNTER — Other Ambulatory Visit: Payer: Self-pay | Admitting: Family Medicine

## 2018-07-17 DIAGNOSIS — K219 Gastro-esophageal reflux disease without esophagitis: Secondary | ICD-10-CM

## 2018-07-17 DIAGNOSIS — E559 Vitamin D deficiency, unspecified: Secondary | ICD-10-CM

## 2018-07-30 ENCOUNTER — Encounter: Payer: Self-pay | Admitting: Gastroenterology

## 2018-07-30 ENCOUNTER — Ambulatory Visit (INDEPENDENT_AMBULATORY_CARE_PROVIDER_SITE_OTHER): Payer: Medicaid Other | Admitting: Gastroenterology

## 2018-07-30 DIAGNOSIS — K644 Residual hemorrhoidal skin tags: Secondary | ICD-10-CM | POA: Insufficient documentation

## 2018-07-30 DIAGNOSIS — K648 Other hemorrhoids: Secondary | ICD-10-CM | POA: Diagnosis not present

## 2018-07-30 DIAGNOSIS — K5900 Constipation, unspecified: Secondary | ICD-10-CM | POA: Diagnosis not present

## 2018-07-30 DIAGNOSIS — K5903 Drug induced constipation: Secondary | ICD-10-CM | POA: Diagnosis not present

## 2018-07-30 MED ORDER — PHENYLEPH-SHARK LIV OIL-MO-PET 0.25-3-14-71.9 % RE OINT: TOPICAL_OINTMENT | RECTAL | 11 refills | Status: DC

## 2018-07-30 MED ORDER — DOCUSATE SODIUM 100 MG PO CAPS: 100.0000 mg | ORAL_CAPSULE | Freq: Two times a day (BID) | ORAL | 1 refills | Status: DC | PRN

## 2018-07-30 MED ORDER — LINACLOTIDE 72 MCG PO CAPS: ORAL_CAPSULE | ORAL | 11 refills | Status: DC

## 2018-07-30 NOTE — Assessment & Plan Note (Signed)
SYMPTOMS NOT IDEALLY CONTROLLED with diet/water/COLACE BID.  FOLLOW A HIGH FIBER DIET. AVOID ITEMS THAT CAUSE BLOATING & GAS. SEE INFO BELOW. ADD LINZESS @ 3PM. YOU SHOULD HAVE A BM 1-3 HRS AFTER THE DOSE IF NOT TAKE THE LINZESS WITH BREAKFAST. IT CAN CAUSE EXPLOSIVE DIARRHEA. IF SO GIVE LINZESS EVERY MON WEDFRI. USE COLACE IF NEEDED TO PREVENT CONSTIPATION. PLEASE CALL WITH QUESTIONS OR CONCERNS.  FOLLOW UP IN 4 MOS.

## 2018-07-30 NOTE — Patient Instructions (Addendum)
FOLLOW A HIGH FIBER DIET. AVOID ITEMS THAT CAUSE BLOATING & GAS. SEE INFO BELOW.   ADD LINZESS @ 3PM. YOU SHOULD HAVE A BM 1-3 HRS AFTER THE DOSE IF NOT TAKE THE LINZESS WITH BREAKFAST. IT CAN CAUSE EXPLOSIVE DIARRHEA. IF SO GIVE LINZESS EVERY MON WEDFRI.  USE COLACE IF NEEDED TO PREVENT CONSTIPATION.   TO MANAGE HEMORRHOIDS:    1. FIRST, USE PREPARATION H FOUR TIMES  A DAY WHEN NEEDED TO RELIEVE RECTAL PAIN/PRESSURE/BLEEDING.    2. USE ANUSOL WHEN HAVING A SEVERE HEMORRHOID FLARE.   PLEASE CALL WITH QUESTIONS OR CONCERNS.  FOLLOW UP IN 4 MOS.    High-Fiber Diet A high-fiber diet changes your normal diet to include more whole grains, legumes, fruits, and vegetables. Changes in the diet involve replacing refined carbohydrates with unrefined foods. The calorie level of the diet is essentially unchanged. The Dietary Reference Intake (recommended amount) for adult males is 38 grams per day. For adult females, it is 25 grams per day. Pregnant and lactating women should consume 28 grams of fiber per day. Fiber is the intact part of a plant that is not broken down during digestion. Functional fiber is fiber that has been isolated from the plant to provide a beneficial effect in the body.  PURPOSE  Increase stool bulk.   Ease and regulate bowel movements.   Lower cholesterol.   REDUCE RISK OF COLON CANCER  INDICATIONS THAT YOU NEED MORE FIBER  Constipation and hemorrhoids.   Uncomplicated diverticulosis (intestine condition) and irritable bowel syndrome.   Weight management.   As a protective measure against hardening of the arteries (atherosclerosis), diabetes, and cancer.   GUIDELINES FOR INCREASING FIBER IN THE DIET  Start adding fiber to the diet slowly. A gradual increase of about 5 more grams (2 slices of whole-wheat bread, 2 servings of most fruits or vegetables, or 1 bowl of high-fiber cereal) per day is best. Too rapid an increase in fiber may result in constipation,  flatulence, and bloating.   Drink enough water and fluids to keep your urine clear or pale yellow. Water, juice, or caffeine-free drinks are recommended. Not drinking enough fluid may cause constipation.   Eat a variety of high-fiber foods rather than one type of fiber.   Try to increase your intake of fiber through using high-fiber foods rather than fiber pills or supplements that contain small amounts of fiber.   The goal is to change the types of food eaten. Do not supplement your present diet with high-fiber foods, but replace foods in your present diet.   INCLUDE A VARIETY OF FIBER SOURCES  Replace refined and processed grains with whole grains, canned fruits with fresh fruits, and incorporate other fiber sources. White rice, white breads, and most bakery goods contain little or no fiber.   Brown whole-grain rice, buckwheat oats, and many fruits and vegetables are all good sources of fiber. These include: broccoli, Brussels sprouts, cabbage, cauliflower, beets, sweet potatoes, white potatoes (skin on), carrots, tomatoes, eggplant, squash, berries, fresh fruits, and dried fruits.   Cereals appear to be the richest source of fiber. Cereal fiber is found in whole grains and bran. Bran is the fiber-rich outer coat of cereal grain, which is largely removed in refining. In whole-grain cereals, the bran remains. In breakfast cereals, the largest amount of fiber is found in those with "bran" in their names. The fiber content is sometimes indicated on the label.   You may need to include additional fruits and vegetables each  day.   In baking, for 1 cup white flour, you may use the following substitutions:   1 cup whole-wheat flour minus 2 tablespoons.   1/2 cup white flour plus 1/2 cup whole-wheat flour.

## 2018-07-30 NOTE — Progress Notes (Signed)
Subjective:    Patient ID: Richard Abbott, male    DOB: Jun 01, 1966, 52 y.o.   MRN: [id number]  Aliene Beams, MD   HPI CONCERNED ABOUT BOWEL MOVEMENTS. HAS TROUBLE WITH CONSTIPATION AND STRAINING. DRINK WATER.ATE SALAD TODAY AND EATS VEGETABLES. HE'S A PICKY EATER. LIVES IN HOME AND MAY USE A WHOLE ROLL OF TOILET PAPER. LIKES TO USE THE BATHROOM PRIOR TO A SHOWER. MAY HAVE A BM ON HIMSELF IF HE'S STANDING UP TO URINATE.   PT DENIES FEVER, CHILLS, HEMATOCHEZIA, HEMATEMESIS, nausea, vomiting, melena, diarrhea, CHEST PAIN, SHORTNESS OF BREATH,  CHANGE IN BOWEL IN HABITS, constipation, abdominal pain, problems swallowing, problems with sedation, heartburn or indigestion.  Past Medical History:  Diagnosis Date  . Anxiety   . Bipolar disorder (HCC)   . GERD (gastroesophageal reflux disease)   . Hypercholesteremia   . Schizophrenia (HCC)   . Type 2 diabetes mellitus (HCC)    Past Surgical History:  Procedure Laterality Date  . BIOPSY  02/11/2018   Procedure: BIOPSY;  Surgeon: West Bali, MD;  [facility];  Service: Endoscopy;;  duodenal biopsy and gastric biopsy  . COLONOSCOPY WITH PROPOFOL N/A 02/11/2018   Procedure: COLONOSCOPY WITH PROPOFOL;  Surgeon: West Bali, MD;  [facility];  Service: Endoscopy;  Laterality: N/A;  1:00pm  . ESOPHAGOGASTRODUODENOSCOPY (EGD) WITH PROPOFOL N/A 02/11/2018   Procedure: ESOPHAGOGASTRODUODENOSCOPY (EGD) WITH PROPOFOL;  Surgeon: West Bali, MD;  [facility];  Service: Endoscopy;  Laterality: N/A;  . LACERATION REPAIR Right    Arm  . POLYPECTOMY  02/11/2018   Procedure: POLYPECTOMY;  Surgeon: West Bali, MD;  [facility];  Service: Endoscopy;;  cecal polyp cb,   Allergies  Allergen Reactions  . Chlorpromazine Other (See Comments)    Jitter feeling   Current Outpatient Medications  Medication Sig    . atorvastatin (LIPITOR) 20 MG tablet Take 1 tablet (20 mg total) by mouth daily.    .  benztropine (COGENTIN) 1 MG tablet Take 1 mg by mouth 3 (three) times daily.    . benztropine (COGENTIN) 2 MG tablet Take 2 mg by mouth every 8 (eight) hours as needed (EPS).     . Cholecalciferol 1000 units capsule 1 cap every am     . DIABETIC TUSSIN EX 100 MG/5ML liquid TAKE 10 MLS BY MOUTH 3 TIMES DAILY AS NEEDED FOR COUCH.    . divalproex (DEPAKOTE) 500 MG DR tablet Take 500 mg by mouth every morning.    . divalproex (DEPAKOTE) 500 MG DR tablet Take 1,000 mg by mouth at bedtime.     . docusate sodium (COLACE) 100 MG capsule Take 1 capsule (100 mg total) BID?    . haloperidol decanoate (HALDOL DECANOATE) 100 MG/ML injection Inject 100 mg into the muscle every 28 (twenty-eight) days.    . hydrocortisone (ANUSOL-HC) 2.5 % rectal cream Place 1 application rectally 2 (two) times daily as needed for hemorrhoids or anal itching.    Marland Kitchen ibuprofen (ADVIL,MOTRIN) 200 MG tablet TAKE 2 TABLETS (400MG ) BY MOUTH EVERY 8 HOURS AS NEEDED FOR HEADACHE OR MILD PAIN    . omeprazole (PRILOSEC) 20 MG capsule TAKE 1 CAPSULE BY MOUTH EACH MORNING AT 6:30AM    . sertraline (ZOLOFT) 100 MG tablet Take 100 mg by mouth every morning.    . Skin Protectants, Misc. (EUCERIN) cream Apply topically as needed for dry skin.    . Sunscreens (BLISTEX LIP BALM) 2-2.5-6.6 % STCK Apply to  lips prn for chap/dry lips    . witch hazel-glycerin (TUCKS) pad Apply 1 application topically as needed for itching.    .      .       Review of Systems PER HPI OTHERWISE ALL SYSTEMS ARE NEGATIVE.    Objective:   Physical Exam  Constitutional: He is oriented to person, place, and time. He appears well-developed and well-nourished. No distress.  HENT:  Head: Normocephalic and atraumatic.  Mouth/Throat: Oropharynx is clear and moist. No oropharyngeal exudate.  Eyes: Pupils are equal, round, and reactive to light. No scleral icterus.  Neck: Normal range of motion. Neck supple.  Cardiovascular: Normal rate, regular rhythm and normal heart  sounds.  Pulmonary/Chest: Effort normal and breath sounds normal. No respiratory distress.  Abdominal: Soft. Bowel sounds are normal. He exhibits no distension. There is no tenderness.  Musculoskeletal: He exhibits no edema.  Lymphadenopathy:    He has no cervical adenopathy.  Neurological: He is alert and oriented to person, place, and time.  Psychiatric: He has a normal mood and affect.  Vitals reviewed.     Assessment & Plan:

## 2018-07-30 NOTE — Assessment & Plan Note (Addendum)
FLARES DUE TO CONSTIPATION BEING UNCONTROLLED.  TO MANAGE HEMORRHOIDS:    1. FIRST, USE PREPARATION H FOUR TIMES  A DAY WHEN NEEDED TO RELIEVE RECTAL PAIN/PRESSURE/BLEEDING.    2. USE ANUSOL WHEN HAVING A SEVERE HEMORRHOID FLARE.  DRINK WATER. FOLLOW A HIGH FIBER DIET. AVOID ITEMS THAT CAUSE BLOATING & GAS.  HANDOUT GIVEN. ADD LINZESS @ 3PM. YOU SHOULD HAVE A BM 1-3 HRS AFTER THE DOSE IF NOT TAKE THE LINZESS WITH BREAKFAST. IT CAN CAUSE EXPLOSIVE DIARRHEA. IF SO GIVE LINZESS EVERY MON WEDFRI. USE COLACE IF NEEDED TO PREVENT CONSTIPATION. FOLLOW UP IN 4 MOS.

## 2018-07-31 NOTE — Progress Notes (Signed)
cc'ed to pcp °

## 2018-08-04 NOTE — Progress Notes (Signed)
ON RECALL  °

## 2018-08-14 ENCOUNTER — Other Ambulatory Visit: Payer: Self-pay | Admitting: Family Medicine

## 2018-11-04 ENCOUNTER — Telehealth: Payer: Self-pay

## 2018-11-04 NOTE — Telephone Encounter (Signed)
Received a faxed letter from the home where pt resides, asking for a new Rx for Linzess 72 mcg. They wanted pt to take it prn since he gets diarrhea with it. Per SF notes, if pt develops diarrhea, have pt take Linzess 72 mcg Mon, Weds, Fridays. RX written and faxed as directed per ov note to [facility].

## 2018-11-05 ENCOUNTER — Telehealth: Payer: Self-pay | Admitting: Gastroenterology

## 2018-11-05 NOTE — Telephone Encounter (Signed)
Spoke with caregiver. New script was sent for Mon, Wed, Friday ok per SF ov notes if pt develops diarrhea. The caregiver said the pt will refuse the medication if he develops diarrhea and they want to cut back on the pts refusals that they have to document, so they would like the RX to say PRN for his Linzess. Caregiver is aware that SF isn't in the office this week.

## 2018-11-05 NOTE — Telephone Encounter (Signed)
Patient's caregiver called and asked if we could make his LInzess prescription prn instead of MWF. She said the patient refuses to take the medicine unless he needs help having a BM. He uses [facility]. Any questions call [phone]

## 2018-11-06 MED ORDER — LINACLOTIDE 72 MCG PO CAPS: ORAL_CAPSULE | ORAL | 11 refills | Status: DC

## 2018-11-06 NOTE — Telephone Encounter (Signed)
PLEASE CALL PT'S FACILITY. RX SENT FOR PRN LINZESS.

## 2018-11-06 NOTE — Addendum Note (Signed)
Addended by: [place] on: 11/06/2018 09:23 PM   Modules accepted: Orders

## 2018-11-07 NOTE — Telephone Encounter (Signed)
Noted. Caregiver is aware of the change.

## 2018-11-12 ENCOUNTER — Encounter: Payer: Self-pay | Admitting: Gastroenterology

## 2018-12-30 ENCOUNTER — Ambulatory Visit (INDEPENDENT_AMBULATORY_CARE_PROVIDER_SITE_OTHER): Payer: Medicaid Other | Admitting: Podiatry

## 2018-12-30 ENCOUNTER — Encounter: Payer: Self-pay | Admitting: Podiatry

## 2018-12-30 DIAGNOSIS — M79675 Pain in left toe(s): Secondary | ICD-10-CM

## 2018-12-30 DIAGNOSIS — M79674 Pain in right toe(s): Secondary | ICD-10-CM

## 2018-12-30 DIAGNOSIS — B351 Tinea unguium: Secondary | ICD-10-CM

## 2018-12-30 DIAGNOSIS — E119 Type 2 diabetes mellitus without complications: Secondary | ICD-10-CM

## 2018-12-30 NOTE — Progress Notes (Signed)
Complaint:  Visit Type: Patient returns to my office for continued preventative foot care services. Complaint: Patient states" my nails on both great toes  have grown long and thick and become painful to walk and wear shoes" Patient has been diagnosed with DM with no foot complications. The patient presents for preventative foot care services. No changes to ROS  Podiatric Exam: Vascular: dorsalis pedis and posterior tibial pulses are palpable bilateral. Capillary return is immediate. Temperature gradient is WNL. Skin turgor WNL  Sensorium: Normal Semmes Weinstein monofilament test. Normal tactile sensation bilaterally. Nail Exam: Pt has thick disfigured discolored nails with subungual debris noted hallux  Bilateral. Ulcer Exam: There is no evidence of ulcer or pre-ulcerative changes or infection. Orthopedic Exam: Muscle tone and strength are WNL. No limitations in general ROM. No crepitus or effusions noted. Foot type and digits show no abnormalities. IPJ DJD right hallux.  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 Skin: No Porokeratosis. No infection or ulcers  Diagnosis:  Onychomycosis x 2.  Pinch callus  Treatment & Plan Procedures and Treatment: Consent by patient was obtained for treatment procedures.   Debridement of mycotic and hypertrophic toenails hallux nails and clearing of subungual debris. No ulceration, no infection noted.  Return Visit-Office Procedure: Patient instructed to return to the office for a follow up visit 4 months for continued evaluation and treatment.    Helane Gunther DPM

## 2019-05-05 ENCOUNTER — Other Ambulatory Visit: Payer: Self-pay

## 2019-05-05 ENCOUNTER — Ambulatory Visit (INDEPENDENT_AMBULATORY_CARE_PROVIDER_SITE_OTHER): Payer: Medicaid Other | Admitting: Podiatry

## 2019-05-05 ENCOUNTER — Encounter: Payer: Self-pay | Admitting: Podiatry

## 2019-05-05 DIAGNOSIS — M79674 Pain in right toe(s): Secondary | ICD-10-CM | POA: Diagnosis not present

## 2019-05-05 DIAGNOSIS — B351 Tinea unguium: Secondary | ICD-10-CM

## 2019-05-05 DIAGNOSIS — L84 Corns and callosities: Secondary | ICD-10-CM | POA: Diagnosis not present

## 2019-05-05 DIAGNOSIS — M79675 Pain in left toe(s): Secondary | ICD-10-CM

## 2019-05-05 NOTE — Progress Notes (Signed)
Complaint:  Visit Type: Patient returns to my office for continued preventative foot care services. Complaint: Patient states" my nails on both great toes  have grown long and thick and become painful to walk and wear shoes" Patient has been diagnosed with DM with no foot complications. The patient presents for preventative foot care services. No changes to ROS.  Patient says the callus on his big toe right foot is painful walking and wearing his shoes.  Podiatric Exam: Vascular: dorsalis pedis and posterior tibial pulses are palpable bilateral. Capillary return is immediate. Temperature gradient is WNL. Skin turgor WNL  Sensorium: Normal Semmes Weinstein monofilament test. Normal tactile sensation bilaterally. Nail Exam: Pt has thick disfigured discolored nails with subungual debris noted hallux  Bilateral. Ulcer Exam: There is no evidence of ulcer or pre-ulcerative changes or infection. Orthopedic Exam: Muscle tone and strength are WNL. No limitations in general ROM. No crepitus or effusions noted. Foot type and digits show no abnormalities. IPJ DJD right hallux.  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 Skin: No Porokeratosis. No infection or ulcers.  Callus right hallux.  Diagnosis:  Onychomycosis x 2.  Pinch callus  Treatment & Plan Procedures and Treatment: Consent by patient was obtained for treatment procedures.   Debridement of mycotic and hypertrophic toenails hallux nails and clearing of subungual debris. No ulceration, no infection noted.  Return Visit-Office Procedure: Patient instructed to return to the office for a follow up visit 3 months for continued evaluation and treatment.    Melisse Caetano DPM 

## 2019-05-10 IMAGING — DX DG CHEST 2V
2 series · 2 of 2 positions shown · non-contrast
Comparison: None in PACs

CLINICAL DATA: Episode of pneumonia 2 months ago. Follow-up chest
x-ray.

EXAM:
CHEST  2 VIEW

[chest pa]
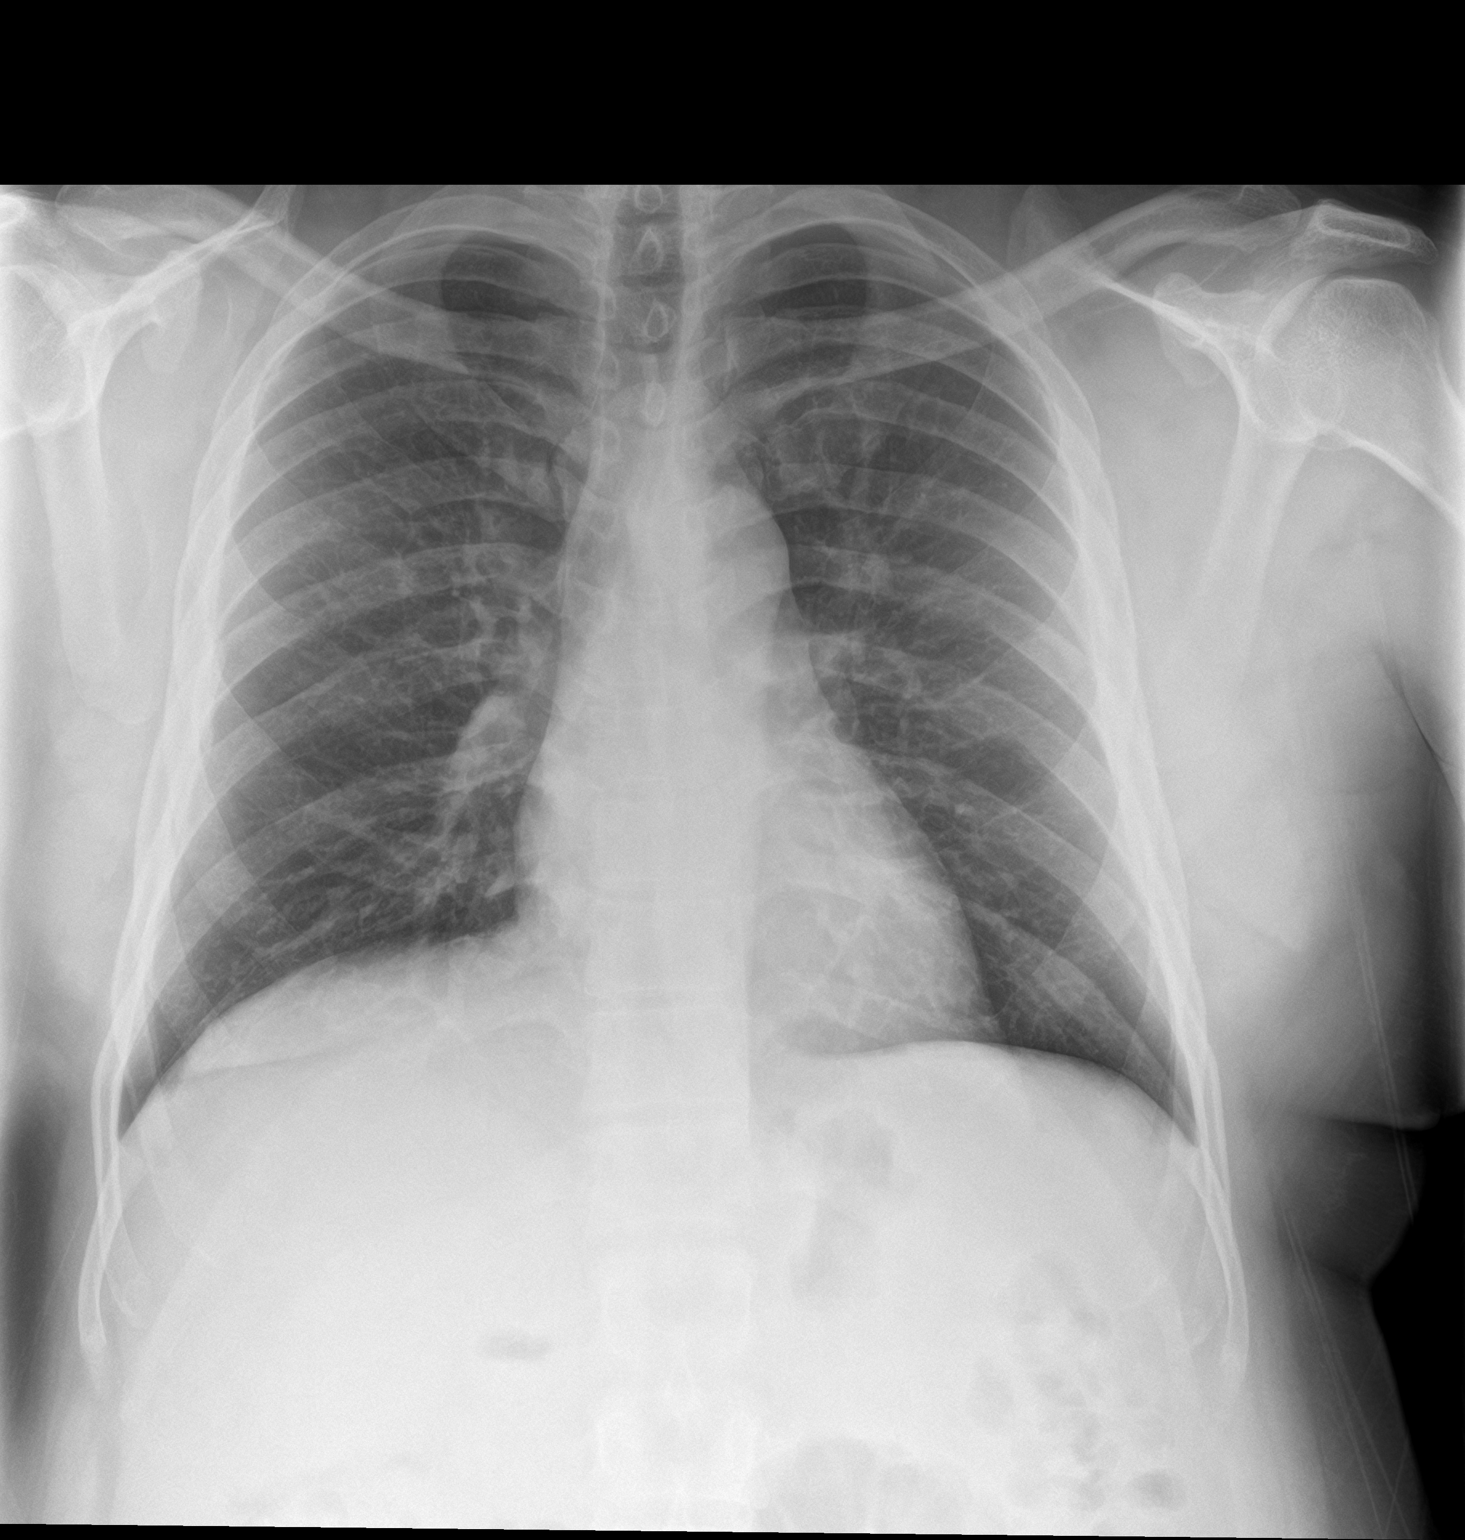

[chest lat]
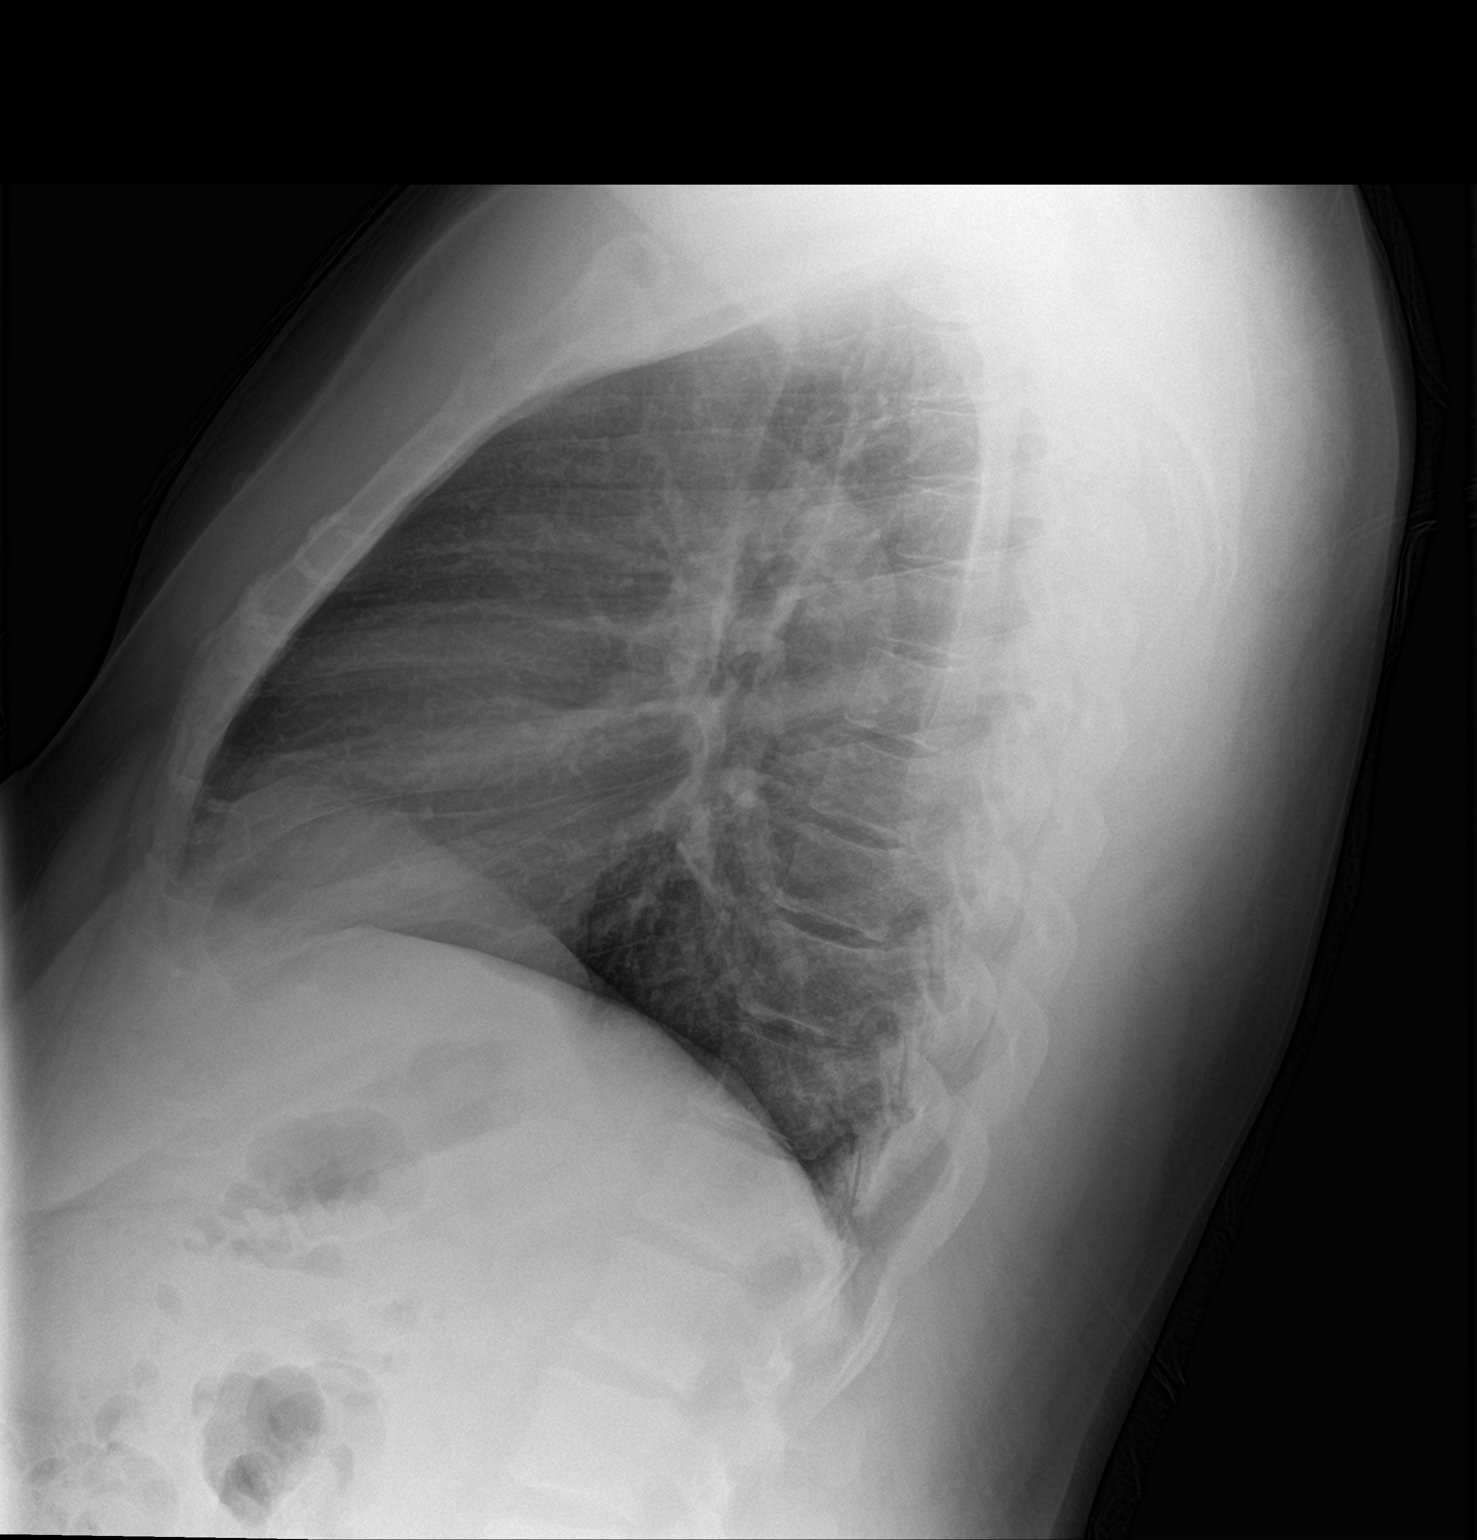

[2 of 2 positions shown; findings below may reference images not displayed]

FINDINGS: The lungs are adequately inflated. There is subtle nodularity in the
right mid lung and in the left perihilar region that likely reflects
pulmonary vessels on end. There is no alveolar infiltrate. There is
no pleural effusion. The heart and pulmonary vascularity are normal.
The mediastinum is normal in width. The bony thorax is unremarkable.
IMPRESSION: No evidence of residual pneumonia.

Probable pulmonary vessels on end resulting in nodular densities in
the right mid lung and left perihilar region. However, the nature of
abnormalities on a previous outside chest x-ray are not known to me.
Comparison of the findings today with the previous chest x-ray
findings would be useful, and I will gladly performed such a
comparison if the images can be provided. Further evaluation with
chest CT scanning is recommended if the previous studies cannot be
obtained for comparison.

## 2019-05-28 ENCOUNTER — Telehealth: Payer: Self-pay | Admitting: Family Medicine

## 2019-05-28 NOTE — Telephone Encounter (Signed)
Please advise 

## 2019-05-28 NOTE — Telephone Encounter (Signed)
Helene Kelp, social worker with [employer] called to see if Caren Macadam was still practicing at [facility] I advised her that she no longer worked here. She would like to know if Cherly Beach, NP could take him on as a new patient. He is on controlled substances medications and I advised her she does not at all treat for Pain Mgt. She said that he is under the care of [facility] for those medications. Can he establish with Orvil Feil still or no?

## 2019-06-04 NOTE — Telephone Encounter (Signed)
Spoke with Helene Kelp and gave her the number for Dr.Lisa Corum to see if she would be willing to take him on as a patient and see if that would be a better fit.

## 2019-08-11 ENCOUNTER — Encounter: Payer: Self-pay | Admitting: Podiatry

## 2019-08-11 ENCOUNTER — Ambulatory Visit (INDEPENDENT_AMBULATORY_CARE_PROVIDER_SITE_OTHER): Payer: Medicaid Other | Admitting: Podiatry

## 2019-08-11 ENCOUNTER — Other Ambulatory Visit: Payer: Self-pay

## 2019-08-11 DIAGNOSIS — E119 Type 2 diabetes mellitus without complications: Secondary | ICD-10-CM

## 2019-08-11 DIAGNOSIS — B351 Tinea unguium: Secondary | ICD-10-CM | POA: Diagnosis not present

## 2019-08-11 DIAGNOSIS — M79675 Pain in left toe(s): Secondary | ICD-10-CM | POA: Diagnosis not present

## 2019-08-11 DIAGNOSIS — M79674 Pain in right toe(s): Secondary | ICD-10-CM | POA: Diagnosis not present

## 2019-08-11 DIAGNOSIS — L84 Corns and callosities: Secondary | ICD-10-CM

## 2019-08-11 NOTE — Progress Notes (Signed)
Complaint:  Visit Type: Patient returns to my office for continued preventative foot care services. Complaint: Patient states" my nails on both great toes  have grown long and thick and become painful to walk and wear shoes" Patient has been diagnosed with DM with no foot complications. The patient presents for preventative foot care services. No changes to ROS.  Patient says the callus on his big toe right foot is painful walking and wearing his shoes.  Podiatric Exam: Vascular: dorsalis pedis and posterior tibial pulses are palpable bilateral. Capillary return is immediate. Temperature gradient is WNL. Skin turgor WNL  Sensorium: Normal Semmes Weinstein monofilament test. Normal tactile sensation bilaterally. Nail Exam: Pt has thick disfigured discolored nails with subungual debris noted hallux  Bilateral. Ulcer Exam: There is no evidence of ulcer or pre-ulcerative changes or infection. Orthopedic Exam: Muscle tone and strength are WNL. No limitations in general ROM. No crepitus or effusions noted. Foot type and digits show no abnormalities. IPJ DJD right hallux.  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 Skin: No Porokeratosis. No infection or ulcers.  Callus right hallux.  Diagnosis:  Onychomycosis x 2.  Pinch callus  Treatment & Plan Procedures and Treatment: Consent by patient was obtained for treatment procedures.   Debridement of mycotic and hypertrophic toenails hallux nails and clearing of subungual debris. No ulceration, no infection noted.  Return Visit-Office Procedure: Patient instructed to return to the office for a follow up visit 3 months for continued evaluation and treatment.    Daxson Reffett DPM 

## 2019-11-10 ENCOUNTER — Ambulatory Visit (INDEPENDENT_AMBULATORY_CARE_PROVIDER_SITE_OTHER): Payer: Medicaid Other | Admitting: Podiatry

## 2019-11-10 ENCOUNTER — Other Ambulatory Visit: Payer: Self-pay

## 2019-11-10 ENCOUNTER — Encounter: Payer: Self-pay | Admitting: Podiatry

## 2019-11-10 DIAGNOSIS — B351 Tinea unguium: Secondary | ICD-10-CM

## 2019-11-10 DIAGNOSIS — E119 Type 2 diabetes mellitus without complications: Secondary | ICD-10-CM

## 2019-11-10 DIAGNOSIS — M79675 Pain in left toe(s): Secondary | ICD-10-CM | POA: Diagnosis not present

## 2019-11-10 DIAGNOSIS — L84 Corns and callosities: Secondary | ICD-10-CM

## 2019-11-10 DIAGNOSIS — M79674 Pain in right toe(s): Secondary | ICD-10-CM | POA: Diagnosis not present

## 2019-11-10 NOTE — Progress Notes (Signed)
Complaint:  Visit Type: Patient returns to my office for continued preventative foot care services. Complaint: Patient states" my nails on both great toes  have grown long and thick and become painful to walk and wear shoes" Patient has been diagnosed with DM with no foot complications. The patient presents for preventative foot care services. No changes to ROS.  Patient says the callus on his big toe right foot is painful walking and wearing his shoes.  Podiatric Exam: Vascular: dorsalis pedis and posterior tibial pulses are palpable bilateral. Capillary return is immediate. Temperature gradient is WNL. Skin turgor WNL  Sensorium: Normal Semmes Weinstein monofilament test. Normal tactile sensation bilaterally. Nail Exam: Pt has thick disfigured discolored nails with subungual debris noted hallux  Bilateral. Ulcer Exam: There is no evidence of ulcer or pre-ulcerative changes or infection. Orthopedic Exam: Muscle tone and strength are WNL. No limitations in general ROM. No crepitus or effusions noted. Foot type and digits show no abnormalities. IPJ DJD right hallux.  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 Skin: No Porokeratosis. No infection or ulcers.  Callus right hallux.  Diagnosis:  Onychomycosis x 2.  Pinch callus  Treatment & Plan Procedures and Treatment: Consent by patient was obtained for treatment procedures.   Debridement of mycotic and hypertrophic toenails hallux nails and clearing of subungual debris. No ulceration, no infection noted.  Return Visit-Office Procedure: Patient instructed to return to the office for a follow up visit 3 months for continued evaluation and treatment.    Helane Gunther DPM

## 2020-02-09 ENCOUNTER — Ambulatory Visit: Payer: Medicare Other | Admitting: Podiatry

## 2020-02-19 ENCOUNTER — Other Ambulatory Visit: Payer: Self-pay

## 2020-02-19 ENCOUNTER — Encounter: Payer: Self-pay | Admitting: Podiatry

## 2020-02-19 ENCOUNTER — Ambulatory Visit (INDEPENDENT_AMBULATORY_CARE_PROVIDER_SITE_OTHER): Payer: Medicaid Other | Admitting: Podiatry

## 2020-02-19 VITALS — Temp 97.4°F

## 2020-02-19 DIAGNOSIS — M79675 Pain in left toe(s): Secondary | ICD-10-CM

## 2020-02-19 DIAGNOSIS — E119 Type 2 diabetes mellitus without complications: Secondary | ICD-10-CM

## 2020-02-19 DIAGNOSIS — B351 Tinea unguium: Secondary | ICD-10-CM

## 2020-02-19 DIAGNOSIS — M79674 Pain in right toe(s): Secondary | ICD-10-CM

## 2020-02-19 DIAGNOSIS — L84 Corns and callosities: Secondary | ICD-10-CM

## 2020-02-19 NOTE — Progress Notes (Signed)
This patient returns to my office for at risk foot care.  This patient requires this care by a professional since this patient will be at risk due to having diabetes type 2.  This patient is unable to cut nails himself since the patient cannot reach his nails.These nails are painful walking and wearing shoes.  This patient presents for at risk foot care today.  General Appearance  Alert, conversant and in no acute stress.  Vascular  Dorsalis pedis and posterior tibial  pulses are palpable  bilaterally.  Capillary return is within normal limits  bilaterally. Temperature is within normal limits  bilaterally.  Neurologic  Senn-Weinstein monofilament wire test within normal limits  bilaterally. Muscle power within normal limits bilaterally.  Nails Thick disfigured discolored nails with subungual debris  Hallux bilaterally.. No evidence of bacterial infection or drainage bilaterally.  Orthopedic  No limitations of motion  feet .  No crepitus or effusions noted.  No bony pathology or digital deformities noted.  IPJ DJD right hallux.  FHL 1st MPJ right foot.  Skin  normotropic skin with no porokeratosis noted bilaterally.  No signs of infections or ulcers noted.   Pinch callus. right hallux  Onychomycosis  Pain in right toes  Pain in left toes  Consent was obtained for treatment procedures.   Mechanical debridement of nails 1-5  bilaterally performed with a nail nipper.  Filed with dremel without incident. Debride pinch callus  Right foot with # 15 blade.   Return office visit    3 months                 Told patient to return for periodic foot care and evaluation due to potential at risk complications.   Sallye Lunz DPM  

## 2020-05-11 ENCOUNTER — Ambulatory Visit: Payer: Medicare Other | Admitting: Podiatry

## 2020-06-10 ENCOUNTER — Encounter: Payer: Self-pay | Admitting: Podiatry

## 2020-06-10 ENCOUNTER — Other Ambulatory Visit: Payer: Self-pay

## 2020-06-10 ENCOUNTER — Ambulatory Visit (INDEPENDENT_AMBULATORY_CARE_PROVIDER_SITE_OTHER): Payer: Medicaid Other | Admitting: Podiatry

## 2020-06-10 DIAGNOSIS — L84 Corns and callosities: Secondary | ICD-10-CM

## 2020-06-10 DIAGNOSIS — M79674 Pain in right toe(s): Secondary | ICD-10-CM

## 2020-06-10 DIAGNOSIS — M79675 Pain in left toe(s): Secondary | ICD-10-CM

## 2020-06-10 DIAGNOSIS — B351 Tinea unguium: Secondary | ICD-10-CM | POA: Diagnosis not present

## 2020-06-10 DIAGNOSIS — E119 Type 2 diabetes mellitus without complications: Secondary | ICD-10-CM

## 2020-06-10 NOTE — Progress Notes (Signed)
This patient returns to my office for at risk foot care.  This patient requires this care by a professional since this patient will be at risk due to having diabetes type 2.  This patient is unable to cut nails himself since the patient cannot reach his nails.These nails are painful walking and wearing shoes.  This patient presents for at risk foot care today.  General Appearance  Alert, conversant and in no acute stress.  Vascular  Dorsalis pedis and posterior tibial  pulses are palpable  bilaterally.  Capillary return is within normal limits  bilaterally. Temperature is within normal limits  bilaterally.  Neurologic  Senn-Weinstein monofilament wire test within normal limits  bilaterally. Muscle power within normal limits bilaterally.  Nails Thick disfigured discolored nails with subungual debris  Hallux bilaterally.. No evidence of bacterial infection or drainage bilaterally.  Orthopedic  No limitations of motion  feet .  No crepitus or effusions noted.  No bony pathology or digital deformities noted.  IPJ DJD right hallux.  FHL 1st MPJ right foot.  Skin  normotropic skin with no porokeratosis noted bilaterally.  No signs of infections or ulcers noted.   Pinch callus. right hallux  Onychomycosis  Pain in right toes  Pain in left toes  Consent was obtained for treatment procedures.   Mechanical debridement of nails 1-5  bilaterally performed with a nail nipper.  Filed with dremel without incident. Debride pinch callus  Right foot with # 15 blade.   Return office visit    3 months                 Told patient to return for periodic foot care and evaluation due to potential at risk complications.   Helane Gunther DPM

## 2020-09-16 ENCOUNTER — Ambulatory Visit: Payer: Medicare Other | Admitting: Podiatry

## 2020-11-16 ENCOUNTER — Other Ambulatory Visit: Payer: Self-pay

## 2020-11-16 ENCOUNTER — Encounter: Payer: Self-pay | Admitting: Podiatry

## 2020-11-16 ENCOUNTER — Ambulatory Visit (INDEPENDENT_AMBULATORY_CARE_PROVIDER_SITE_OTHER): Payer: Medicaid Other | Admitting: Podiatry

## 2020-11-16 DIAGNOSIS — E119 Type 2 diabetes mellitus without complications: Secondary | ICD-10-CM

## 2020-11-16 DIAGNOSIS — M79675 Pain in left toe(s): Secondary | ICD-10-CM | POA: Diagnosis not present

## 2020-11-16 DIAGNOSIS — L84 Corns and callosities: Secondary | ICD-10-CM

## 2020-11-16 DIAGNOSIS — B351 Tinea unguium: Secondary | ICD-10-CM | POA: Diagnosis not present

## 2020-11-16 DIAGNOSIS — M79674 Pain in right toe(s): Secondary | ICD-10-CM

## 2020-11-16 NOTE — Progress Notes (Signed)
This patient returns to my office for at risk foot care.  This patient requires this care by a professional since this patient will be at risk due to having diabetes type 2.  This patient is unable to cut nails himself since the patient cannot reach his nails.These nails are painful walking and wearing shoes.  This patient presents for at risk foot care today.  General Appearance  Alert, conversant and in no acute stress.  Vascular  Dorsalis pedis and posterior tibial  pulses are palpable  bilaterally.  Capillary return is within normal limits  bilaterally. Temperature is within normal limits  bilaterally.  Neurologic  Senn-Weinstein monofilament wire test within normal limits  bilaterally. Muscle power within normal limits bilaterally.  Nails Thick disfigured discolored nails with subungual debris  Hallux bilaterally.. No evidence of bacterial infection or drainage bilaterally.  Orthopedic  No limitations of motion  feet .  No crepitus or effusions noted.  No bony pathology or digital deformities noted.  IPJ DJD right hallux.  FHL 1st MPJ right foot.  Skin  normotropic skin with no porokeratosis noted bilaterally.  No signs of infections or ulcers noted.   Pinch callus B/L.  Onychomycosis  Pain in right toes  Pain in left toes  Consent was obtained for treatment procedures.   Mechanical debridement of nails 1-5  bilaterally performed with a nail nipper.  Filed with dremel without incident. Debride pinch callus  B/L  with # 15 blade.   Return office visit    4 months                 Told patient to return for periodic foot care and evaluation due to potential at risk complications.   Helane Gunther DPM

## 2021-02-14 ENCOUNTER — Ambulatory Visit: Payer: Medicare Other | Admitting: Podiatry

## 2021-10-19 ENCOUNTER — Ambulatory Visit: Payer: Medicare Other | Admitting: Podiatry

## 2021-11-13 ENCOUNTER — Ambulatory Visit (INDEPENDENT_AMBULATORY_CARE_PROVIDER_SITE_OTHER): Payer: Medicare Other | Admitting: Podiatry

## 2021-11-13 ENCOUNTER — Other Ambulatory Visit: Payer: Self-pay

## 2021-11-13 ENCOUNTER — Encounter: Payer: Self-pay | Admitting: Podiatry

## 2021-11-13 DIAGNOSIS — L84 Corns and callosities: Secondary | ICD-10-CM | POA: Diagnosis not present

## 2021-11-13 DIAGNOSIS — M79674 Pain in right toe(s): Secondary | ICD-10-CM

## 2021-11-13 DIAGNOSIS — M79675 Pain in left toe(s): Secondary | ICD-10-CM | POA: Diagnosis not present

## 2021-11-13 DIAGNOSIS — E119 Type 2 diabetes mellitus without complications: Secondary | ICD-10-CM | POA: Diagnosis not present

## 2021-11-13 DIAGNOSIS — B351 Tinea unguium: Secondary | ICD-10-CM

## 2021-11-13 NOTE — Progress Notes (Signed)
This patient returns to my office for at risk foot care.  This patient requires this care by a professional since this patient will be at risk due to having diabetes type 2.  This patient is unable to cut nails himself since the patient cannot reach his nails.These nails are painful walking and wearing shoes. Patient has painful callus both big toes. This patient presents for at risk foot care today.  General Appearance  Alert, conversant and in no acute stress.  Vascular  Dorsalis pedis and posterior tibial  pulses are palpable  bilaterally.  Capillary return is within normal limits  bilaterally. Temperature is within normal limits  bilaterally.  Neurologic  Senn-Weinstein monofilament wire test within normal limits  bilaterally. Muscle power within normal limits bilaterally.  Nails Thick disfigured discolored nails with subungual debris 1-5  B/L.   No evidence of bacterial infection or drainage bilaterally.  Orthopedic  No limitations of motion  feet .  No crepitus or effusions noted.  No bony pathology or digital deformities noted.  IPJ DJD right hallux.  FHL 1st MPJ right foot.  Skin  normotropic skin with no porokeratosis noted bilaterally.  No signs of infections or ulcers noted.   Pinch callus B/L.  Onychomycosis  Pain in right toes  Pain in left toes  Pinch callus  B/L.  Consent was obtained for treatment procedures.   Mechanical debridement of nails 1-5  bilaterally performed with a nail nipper.  Filed with dremel without incident. Debride pinch callus  B/L  with # 15 blade.   Return office visit    6 months                 Told patient to return for periodic foot care and evaluation due to potential at risk complications.   Gardiner Barefoot DPM

## 2021-12-12 ENCOUNTER — Telehealth: Payer: Self-pay | Admitting: *Deleted

## 2021-12-12 NOTE — Telephone Encounter (Signed)
"  I'm on survey at [facility].  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  If someone could give me a call back.  My phone number is [phone]."  I attempted to return her call.  I left her a message to call me back.

## 2021-12-28 NOTE — Telephone Encounter (Signed)
I am returning your call in regards to [facility].  I attempted previously with no success.  "I went on vacation.  I've left that facility and closed his record.  I appreciate you calling me back, several people do not." ?

## 2022-04-02 ENCOUNTER — Encounter: Payer: Self-pay | Admitting: Podiatry

## 2022-04-02 ENCOUNTER — Ambulatory Visit (INDEPENDENT_AMBULATORY_CARE_PROVIDER_SITE_OTHER): Payer: Medicare Other | Admitting: Podiatry

## 2022-04-02 DIAGNOSIS — M79674 Pain in right toe(s): Secondary | ICD-10-CM

## 2022-04-02 DIAGNOSIS — B351 Tinea unguium: Secondary | ICD-10-CM | POA: Diagnosis not present

## 2022-04-02 DIAGNOSIS — E119 Type 2 diabetes mellitus without complications: Secondary | ICD-10-CM | POA: Diagnosis not present

## 2022-04-02 DIAGNOSIS — L84 Corns and callosities: Secondary | ICD-10-CM

## 2022-04-02 DIAGNOSIS — M79675 Pain in left toe(s): Secondary | ICD-10-CM

## 2022-04-02 NOTE — Progress Notes (Signed)
This patient returns to my office for at risk foot care.  This patient requires this care by a professional since this patient will be at risk due to having diabetes type 2.  This patient is unable to cut nails himself since the patient cannot reach his nails.These nails are painful walking and wearing shoes. Patient has painful callus both feet. This patient presents for at risk foot care today.  General Appearance  Alert, conversant and in no acute stress.  Vascular  Dorsalis pedis and posterior tibial  pulses are palpable  bilaterally.  Capillary return is within normal limits  bilaterally. Temperature is within normal limits  bilaterally.  Neurologic  Senn-Weinstein monofilament wire test within normal limits  bilaterally. Muscle power within normal limits bilaterally.  Nails Thick disfigured discolored nails with subungual debris 1-5  B/L.   No evidence of bacterial infection or drainage bilaterally.  Orthopedic  No limitations of motion  feet .  No crepitus or effusions noted.  No bony pathology or digital deformities noted.  IPJ DJD right hallux.  FHL 1st MPJ right foot.  Skin  normotropic skin with no porokeratosis noted bilaterally.  No signs of infections or ulcers noted.   Pinch callus right.  Callus sub left hallux and sub 5th met left foot.  Onychomycosis  Pain in right toes  Pain in left toes  Pinch callus  B/L.  Consent was obtained for treatment procedures.   Mechanical debridement of nails 1-5  bilaterally performed with a nail nipper.  Filed with dremel without incident. Debride pinch callus  B/L  with # 15 blade. Padding dispensed.   Return office visit    3 months                 Told patient to return for periodic foot care and evaluation due to potential at risk complications.   Gardiner Barefoot DPM

## 2022-05-14 ENCOUNTER — Ambulatory Visit: Payer: Medicare Other | Admitting: Podiatry

## 2022-07-05 ENCOUNTER — Ambulatory Visit: Payer: Medicare Other | Admitting: Podiatry

## 2022-07-26 ENCOUNTER — Ambulatory Visit: Payer: Medicare Other | Admitting: Podiatry

## 2022-08-07 ENCOUNTER — Ambulatory Visit: Payer: Medicare Other | Admitting: Podiatry

## 2022-08-16 ENCOUNTER — Ambulatory Visit (INDEPENDENT_AMBULATORY_CARE_PROVIDER_SITE_OTHER): Payer: Medicare Other | Admitting: Podiatry

## 2022-08-16 ENCOUNTER — Encounter: Payer: Self-pay | Admitting: Podiatry

## 2022-08-16 DIAGNOSIS — M79674 Pain in right toe(s): Secondary | ICD-10-CM | POA: Diagnosis not present

## 2022-08-16 DIAGNOSIS — E119 Type 2 diabetes mellitus without complications: Secondary | ICD-10-CM

## 2022-08-16 DIAGNOSIS — B351 Tinea unguium: Secondary | ICD-10-CM

## 2022-08-16 DIAGNOSIS — M79675 Pain in left toe(s): Secondary | ICD-10-CM | POA: Diagnosis not present

## 2022-08-16 NOTE — Progress Notes (Signed)
This patient returns to my office for at risk foot care.  This patient requires this care by a professional since this patient will be at risk due to having diabetes type 2.  This patient is unable to cut nails himself since the patient cannot reach his nails.These nails are painful walking and wearing shoes. Patient has painful callus both feet. This patient presents for at risk foot care today.  General Appearance  Alert, conversant and in no acute stress.  Vascular  Dorsalis pedis and posterior tibial  pulses are palpable  bilaterally.  Capillary return is within normal limits  bilaterally. Temperature is within normal limits  bilaterally.  Neurologic  Senn-Weinstein monofilament wire test within normal limits  bilaterally. Muscle power within normal limits bilaterally.  Nails Thick disfigured discolored nails with subungual debris 1-5  B/L.   No evidence of bacterial infection or drainage bilaterally.  Orthopedic  No limitations of motion  feet .  No crepitus or effusions noted.  No bony pathology or digital deformities noted.  IPJ DJD right hallux.  FHL 1st MPJ right foot.  Skin  normotropic skin with no porokeratosis noted bilaterally.  No signs of infections or ulcers noted.   Pinch callus right.  Callus sub left hallux and sub 5th met left foot.  Onychomycosis  Pain in right toes  Pain in left toes  Pinch callus  B/L.  Consent was obtained for treatment procedures.   Mechanical debridement of nails 1-5  bilaterally performed with a nail nipper.  Filed with dremel without incident. Debride pinch callus  B/L  with # 15 blade. Padding dispensed.   Return office visit    3 months                 Told patient to return for periodic foot care and evaluation due to potential at risk complications. Boneta Lucks

## 2022-09-26 ENCOUNTER — Encounter: Payer: Self-pay | Admitting: Emergency Medicine

## 2022-09-26 ENCOUNTER — Emergency Department
Admission: EM | Admit: 2022-09-26 | Discharge: 2022-09-27 | Disposition: A | Discharge status: Home / Self Care | Payer: Medicare Other | Attending: Emergency Medicine | Admitting: Emergency Medicine

## 2022-09-26 DIAGNOSIS — Z79899 Other long term (current) drug therapy: Secondary | ICD-10-CM | POA: Diagnosis not present

## 2022-09-26 DIAGNOSIS — F209 Schizophrenia, unspecified: Secondary | ICD-10-CM | POA: Diagnosis not present

## 2022-09-26 DIAGNOSIS — F17210 Nicotine dependence, cigarettes, uncomplicated: Secondary | ICD-10-CM | POA: Insufficient documentation

## 2022-09-26 DIAGNOSIS — F09 Unspecified mental disorder due to known physiological condition: Secondary | ICD-10-CM

## 2022-09-26 DIAGNOSIS — E119 Type 2 diabetes mellitus without complications: Secondary | ICD-10-CM | POA: Insufficient documentation

## 2022-09-26 DIAGNOSIS — E1169 Type 2 diabetes mellitus with other specified complication: Secondary | ICD-10-CM | POA: Diagnosis present

## 2022-09-26 DIAGNOSIS — R4689 Other symptoms and signs involving appearance and behavior: Secondary | ICD-10-CM

## 2022-09-26 DIAGNOSIS — F71 Moderate intellectual disabilities: Secondary | ICD-10-CM | POA: Diagnosis present

## 2022-09-26 DIAGNOSIS — Z046 Encounter for general psychiatric examination, requested by authority: Secondary | ICD-10-CM | POA: Diagnosis present

## 2022-09-26 LAB — ETHANOL: Alcohol, Ethyl (B): <10 mg/dL (ref <10)

## 2022-09-26 LAB — CBC
HCT: 40 % (ref 39.0–52.0)
Hemoglobin: 13.5 g/dL (ref 13.0–17.0)
MCH: 31.1 pg (ref 26.0–34.0)
MCHC: 33.8 g/dL (ref 30.0–36.0)
MCV: 92.2 fL (ref 80.0–100.0)
Platelets: 263 10*3/uL (ref 150–400)
RBC: 4.34 MIL/uL (ref 4.22–5.81)
RDW: 14.8 % (ref 11.5–15.5)
WBC: 7.2 10*3/uL (ref 4.0–10.5)
nRBC: 0 % (ref 0.0–0.2)

## 2022-09-26 LAB — URINE DRUG SCREEN, QUALITATIVE (ARMC ONLY)
Amphetamines, Ur Screen: NOT DETECTED
Barbiturates, Ur Screen: NOT DETECTED
Benzodiazepine, Ur Scrn: NOT DETECTED
Cannabinoid 50 Ng, Ur ~~LOC~~: POSITIVE — AB
Cocaine Metabolite,Ur ~~LOC~~: NOT DETECTED
MDMA (Ecstasy)Ur Screen: NOT DETECTED
Methadone Scn, Ur: NOT DETECTED
Opiate, Ur Screen: NOT DETECTED
Phencyclidine (PCP) Ur S: NOT DETECTED
Tricyclic, Ur Screen: NOT DETECTED

## 2022-09-26 LAB — COMPREHENSIVE METABOLIC PANEL
ALT: 19 U/L (ref 0–44)
AST: 22 U/L (ref 15–41)
Albumin: 4 g/dL (ref 3.5–5.0)
Alkaline Phosphatase: 55 U/L (ref 38–126)
Anion gap: 5 (ref 5–15)
BUN: 21 mg/dL — ABNORMAL HIGH (ref 6–20)
CO2: 29 mmol/L (ref 22–32)
Calcium: 8.9 mg/dL (ref 8.9–10.3)
Chloride: 110 mmol/L (ref 98–111)
Creatinine, Ser: 0.96 mg/dL (ref 0.61–1.24)
GFR, Estimated: >60 mL/min (ref >60)
Glucose, Bld: 113 mg/dL — ABNORMAL HIGH (ref 70–99)
Potassium: 4.1 mmol/L (ref 3.5–5.1)
Sodium: 144 mmol/L (ref 135–145)
Total Bilirubin: 0.6 mg/dL (ref 0.3–1.2)
Total Protein: 8 g/dL (ref 6.5–8.1)

## 2022-09-26 LAB — SALICYLATE LEVEL: Salicylate Lvl: <7 mg/dL — ABNORMAL LOW (ref 7.0–30.0)

## 2022-09-26 LAB — ACETAMINOPHEN LEVEL: Acetaminophen (Tylenol), Serum: <10 ug/mL — ABNORMAL LOW (ref 10–30)

## 2022-09-26 LAB — VALPROIC ACID LEVEL: Valproic Acid Lvl: 70 ug/mL (ref 50.0–100.0)
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
## 2022-09-26 NOTE — ED Notes (Signed)
Writer tried to notify Legal Guardian Ashok Cordia with [facility] DSS of patient in ED.  HIPAA compliant message left for return call.  Writer attempted to call Group home [facility] with no answer.
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
## 2022-09-26 NOTE — ED Notes (Addendum)
Pt dressed out by this RN & EDT [facility]) belongings include:  Black sneakers 1 black watch 1 red shirt 1 pair of black sweat pants 1 gray jacket 1 pair of black socks 1 pair of white pants  1 white tank top Black underwear

## 2022-09-27 DIAGNOSIS — F209 Schizophrenia, unspecified: Secondary | ICD-10-CM | POA: Diagnosis not present

## 2022-09-27 MED ORDER — DIVALPROEX SODIUM 500 MG PO DR TAB: 1000.0000 mg | DELAYED_RELEASE_TABLET | Freq: Every day | ORAL | Status: DC

## 2022-09-27 MED ORDER — DOCUSATE SODIUM 100 MG PO CAPS: 100.0000 mg | ORAL_CAPSULE | Freq: Two times a day (BID) | ORAL | Status: DC | PRN

## 2022-09-27 MED ORDER — DIVALPROEX SODIUM 500 MG PO DR TAB
500.0000 mg | DELAYED_RELEASE_TABLET | ORAL | Status: DC
  Administered 2022-09-27: 500 mg via ORAL
  Filled 2022-09-27: qty 1

## 2022-09-27 MED ORDER — SERTRALINE HCL 50 MG PO TABS
100.0000 mg | ORAL_TABLET | ORAL | Status: DC
  Administered 2022-09-27: 100 mg via ORAL
  Filled 2022-09-27: qty 2

## 2022-09-27 MED ORDER — BENZTROPINE MESYLATE 1 MG PO TABS
1.0000 mg | ORAL_TABLET | Freq: Three times a day (TID) | ORAL | Status: DC
  Administered 2022-09-27: 1 mg via ORAL
  Filled 2022-09-27: qty 1

## 2022-09-27 NOTE — ED Notes (Addendum)
RN called Alden Benjamin at [phone] who is listed as legal guardian, no answer-left HIPAA compliant VM. This RN then called  Warrick Parisian [phone]  Who answered and identified as an old group home.   Rn then called [facility] at [phone] and spoke with  Supervisor, Meyer Cory who provided legal guardian information as still being with Hermine Messick, but social worker Quinn Axe, phone number [phone] and Director of Group Home [place]. [phone]. RN called Quinn Axe [place] and left HIPAA compliant VM as well as Barbie Banner and left HIPAA compliant VM.  Quinn Axe, [place] DSS returned call and gave permission to treat as well as information to fax over legal guardian paperwork. He, too explained that he was awaiting conversation with Barbie Banner (group home director) to see if they were willing to take back patient to [facility]. States that he will update nursing staff as soon as he is updated on disposition for patient.

## 2022-09-27 NOTE — ED Notes (Signed)
Hospital meal provided.  100% consumed, pt tolerated w/o complaints.  Waste discarded appropriately.   

## 2022-09-27 NOTE — ED Notes (Addendum)
Barbie Banner Jr.-Director of [facility] returned phone call and approves for patient to be discharged back to group home today. He does not have transportation for patient.  RN then called [place] DSS guardian Kandra Nicolas who agrees with this plan and approves for patient to be returned to group home via [employer]. Gives consent for rider waiver. RN confirmed correct address with [facility].

## 2022-09-27 NOTE — Consult Note (Signed)
[facility]  09/27/2022 9:26 AM Richard Abbott  MRN:  [id number]   Method of visit?: Face to Face   Subjective:"The dude came into my room swinging."  Patient re-assessed this morning. There have been no unsafe behaviors in the ED. He is alert and oriented x3. He is pleasant, calm, cooperative. Denies auditory or visual hallucinations. He does not appear to be responding to internal stimuli; perceptions appear to be WNL. Patient says that he has been taking his medications; his valproic acid level was WNL (70) at 1931 yesterday.  Patient denies suicidal or homicidal though or intent. He says this "dude" and him could probably talk and work things out because the "dude" has bothered him before. Patient states that he likes the group home and wants to return.   Patient does not meet criteria for inpatient psychiatric hospitalization.   Writer attempted to contact group home owner, Michaela Cornerlazia Fountain [phone]). No answer. Writer left HIPAA compliant message on voicemail.  Attempted to reach guardian listed in chart, Ashok CordiaMaria Johnson [phone]). Unable to leave a voice message, "call cannot be completed."  Principal Problem: Schizophrenia (HCC) Diagnosis:  Principal Problem:   Schizophrenia (HCC) Active Problems:   Cognitive dysfunction   Morbid obesity (HCC)   Type 2 diabetes mellitus with other specified complication (HCC)   Moderate intellectual disabilities  Total Time spent with patient: 20 minutes  Past Psychiatric History: schizophrenia  Past Medical History:  Past Medical History:  Diagnosis Date   Anxiety    Bipolar disorder (HCC)    GERD (gastroesophageal reflux disease)    Hypercholesteremia    Schizophrenia (HCC)    Type 2 diabetes mellitus (HCC)     Past Surgical History:  Procedure Laterality Date   BIOPSY  02/11/2018   Procedure: BIOPSY;  Surgeon: West BaliFields, Sandi L, MD;  [facility];  Service: Endoscopy;;  duodenal biopsy and gastric  biopsy   COLONOSCOPY WITH PROPOFOL N/A 02/11/2018   Procedure: COLONOSCOPY WITH PROPOFOL;  Surgeon: West BaliFields, Sandi L, MD;  Location: [facility];  Service: Endoscopy;  Laterality: N/A;  1:00pm   ESOPHAGOGASTRODUODENOSCOPY (EGD) WITH PROPOFOL N/A 02/11/2018   Procedure: ESOPHAGOGASTRODUODENOSCOPY (EGD) WITH PROPOFOL;  Surgeon: West BaliFields, Sandi L, MD;  [facility];  Service: Endoscopy;  Laterality: N/A;   LACERATION REPAIR Right    Arm   POLYPECTOMY  02/11/2018   Procedure: POLYPECTOMY;  Surgeon: West BaliFields, Sandi L, MD;  Location: [facility];  Service: Endoscopy;;  cecal polyp cb,   Family History:  Family History  Problem Relation Age of Onset   Colon cancer Neg Hx    Colon polyps Neg Hx    Family Psychiatric  History:  Social History:  Social History   Substance and Sexual Activity  Alcohol Use No   Comment: Prior history of alcohol abuse     Social History   Substance and Sexual Activity  Drug Use No   Comment: Prior history of drug abuse    Social History   Socioeconomic History   Marital status: Single    Spouse name: Not on file   Number of children: Not on file   Years of education: Not on file   Highest education level: Not on file  Occupational History   Not on file  Tobacco Use   Smoking status: Every Day    Packs/day: 0.25    Years: 20.00    Total pack years: 5.00    Types: Cigarettes, Cigars   Smokeless tobacco: Never  Tobacco comments:    Patient unsure of date where he began smoking but has been smoking for many years.  Vaping Use   Vaping Use: Never used  Substance and Sexual Activity   Alcohol use: No    Comment: Prior history of alcohol abuse   Drug use: No    Comment: Prior history of drug abuse   Sexual activity: Not Currently    Partners: Male    Comment: Denies that he is currently sexually active  Other Topics Concern   Not on file  Social History Narrative   Grew up in  [place], [place].    Reports that he has a big family.     Has one brother and five sisters.   Father died. Mother living.  Does not have contact with his family.   Wants to move back to [place].      Moved in group home on 07/10/2017 from [facility] for 3 years.    Was in [place] for 3 years after being incarcerated for drugs/alcohol/burgularly.    DSS is Guardian.       Social Determinants of Health   Financial Resource Strain: Not on file  Food Insecurity: Not on file  Transportation Needs: Not on file  Physical Activity: Not on file  Stress: Not on file  Social Connections: Not on file    Sleep: Good  Appetite:  Good  Current Medications: Current Facility-Administered Medications  Medication Dose Route Frequency Provider Last Rate Last Admin   benztropine (COGENTIN) tablet 1 mg  1 mg Oral TID Gabriel Cirri F, NP   1 mg at 09/27/22 0907   divalproex (DEPAKOTE) DR tablet 1,000 mg  1,000 mg Oral QHS Gabriel Cirri F, NP       divalproex (DEPAKOTE) DR tablet 500 mg  500 mg Oral BH-q7a Makala Fetterolf F, NP   500 mg at 09/27/22 4540   docusate sodium (COLACE) capsule 100 mg  100 mg Oral BID PRN Vanetta Mulders, NP       sertraline (ZOLOFT) tablet 100 mg  100 mg Oral BH-q7a Patt Steinhardt F, NP   100 mg at 09/27/22 0907   Current Outpatient Medications  Medication Sig Dispense Refill   atorvastatin (LIPITOR) 20 MG tablet Take 1 tablet (20 mg total) by mouth daily. 90 tablet 0   benztropine (COGENTIN) 1 MG tablet Take 1 mg by mouth 3 (three) times daily.     Cholecalciferol 1000 units capsule 1 cap every am  (Patient taking differently: Take 1,000 Units by mouth daily.) 30 capsule 11   divalproex (DEPAKOTE) 500 MG DR tablet Take 500 mg by mouth every morning.     divalproex (DEPAKOTE) 500 MG DR tablet Take 1,000 mg by mouth at bedtime.      omeprazole (PRILOSEC) 20 MG capsule TAKE 1 CAPSULE BY MOUTH EACH MORNING AT 6:30AM (Patient taking differently: Take 20 mg by mouth daily.) 30 capsule 11   sertraline (ZOLOFT) 100  MG tablet Take 100 mg by mouth every morning.     traZODone (DESYREL) 50 MG tablet Take 50 mg by mouth at bedtime.     benztropine (COGENTIN) 2 MG tablet Take 2 mg by mouth every 8 (eight) hours as needed (EPS).      DIABETIC TUSSIN EX 100 MG/5ML liquid TAKE 10 MLS BY MOUTH 3 TIMES DAILY AS NEEDED FOR COUCH. (Patient taking differently: Take 10 mLs by mouth 3 (three) times daily as needed for cough.) 118 mL 11   docusate sodium (  COLACE) 100 MG capsule Take 1 capsule (100 mg total) by mouth 2 (two) times daily as needed for mild constipation. 90 capsule 1   haloperidol decanoate (HALDOL DECANOATE) 100 MG/ML injection Inject 100 mg into the muscle every 28 (twenty-eight) days.     hydrocortisone (ANUSOL-HC) 2.5 % rectal cream Place 1 application rectally 2 (two) times daily as needed for hemorrhoids or anal itching. 30 g 0   ibuprofen (ADVIL,MOTRIN) 200 MG tablet TAKE 2 TABLETS (400MG ) BY MOUTH EVERY 8 HOURS AS NEEDED FOR HEADACHE OR MILD PAIN 60 tablet 11   linaclotide (LINZESS) 72 MCG capsule 1 PO DAILY PRN CONSTIPATION. (Patient taking differently: Take 72 mcg by mouth daily as needed. 1 PO DAILY PRN CONSTIPATION.) 30 capsule 11   phenylephrine-shark liver oil-mineral oil-petrolatum (PREPARATION H) 0.25-3-14-71.9 % rectal ointment 1 APPLICATION FOUR TIMES A DAY WHEN NEEDED TO REDUCE RECTAL PAIN/BLEEDING. 30 g 11   Skin Protectants, Misc. (EUCERIN) cream Apply topically as needed for dry skin. 454 g 2   Sunscreens (BLISTEX LIP BALM) 2-2.5-6.6 % STCK Apply to lips prn for chap/dry lips 1 each 11   witch hazel-glycerin (TUCKS) pad Apply 1 application topically as needed for itching. 40 each 12    Lab Results:  Results for orders placed or performed during the hospital encounter of 09/26/22 (from the past 48 hour(s))  Comprehensive metabolic panel     Status: Abnormal   Collection Time: 09/26/22  7:15 PM  Result Value Ref Range   Sodium 144 135 - 145 mmol/L   Potassium 4.1 3.5 - 5.1 mmol/L    Chloride 110 98 - 111 mmol/L   CO2 29 22 - 32 mmol/L   Glucose, Bld 113 (H) 70 - 99 mg/dL    Comment: Glucose reference range applies only to samples taken after fasting for at least 8 hours.   BUN 21 (H) 6 - 20 mg/dL   Creatinine, Ser 09/28/22 0.61 - 1.24 mg/dL   Calcium 8.9 8.9 - 6.31 mg/dL   Total Protein 8.0 6.5 - 8.1 g/dL   Albumin 4.0 3.5 - 5.0 g/dL   AST 22 15 - 41 U/L   ALT 19 0 - 44 U/L   Alkaline Phosphatase 55 38 - 126 U/L   Total Bilirubin 0.6 0.3 - 1.2 mg/dL   GFR, Estimated 49.7 >02 mL/min    Comment: (NOTE) Calculated using the CKD-EPI Creatinine Equation (2021)    Anion gap 5 5 - 15    Comment: Performed at [facility], [address], [place], [place]  Ethanol     Status: None   Collection Time: 09/26/22  7:15 PM  Result Value Ref Range   Alcohol, Ethyl (B) <10 <10 mg/dL    Comment: (NOTE) Lowest detectable limit for serum alcohol is 10 mg/dL.  For medical purposes only. Performed at [facility], [address] [place], [place]   Salicylate level     Status: Abnormal   Collection Time: 09/26/22  7:15 PM  Result Value Ref Range   Salicylate Lvl <7.0 (L) 7.0 - 30.0 mg/dL    Comment: Performed at [facility], [address] [place], [place]  Acetaminophen level     Status: Abnormal   Collection Time: 09/26/22  7:15 PM  Result Value Ref Range   Acetaminophen (Tylenol), Serum <10 (L) 10 - 30 ug/mL    Comment: (NOTE) Therapeutic concentrations vary significantly. A range of 10-30 ug/mL  may be an effective concentration for  many patients. However, some  are best treated at concentrations outside of this range. Acetaminophen concentrations >150 ug/mL at 4 hours after ingestion  and >50 ug/mL at 12 hours after ingestion are often associated with  toxic reactions.  Performed at [facility], [address] [place], [place] [address]   cbc     Status: None   Collection Time: 09/26/22  7:15  PM  Result Value Ref Range   WBC 7.2 4.0 - 10.5 K/uL   RBC 4.34 4.22 - 5.81 MIL/uL   Hemoglobin 13.5 13.0 - 17.0 g/dL   HCT 91.4 78.2 - 95.6 %   MCV 92.2 80.0 - 100.0 fL   MCH 31.1 26.0 - 34.0 pg   MCHC 33.8 30.0 - 36.0 g/dL   RDW 21.3 08.6 - 57.8 %   Platelets 263 150 - 400 K/uL   nRBC 0.0 0.0 - 0.2 %    Comment: Performed at [facility], [address] [place], [place] [address]  Urine Drug Screen, Qualitative     Status: Abnormal   Collection Time: 09/26/22  7:15 PM  Result Value Ref Range   Tricyclic, Ur Screen NONE DETECTED NONE DETECTED   Amphetamines, Ur Screen NONE DETECTED NONE DETECTED   MDMA (Ecstasy)Ur Screen NONE DETECTED NONE DETECTED   Cocaine Metabolite,Ur [place] NONE DETECTED NONE DETECTED   Opiate, Ur Screen NONE DETECTED NONE DETECTED   Phencyclidine (PCP) Ur S NONE DETECTED NONE DETECTED   Cannabinoid 50 Ng, Ur [place] POSITIVE (A) NONE DETECTED   Barbiturates, Ur Screen NONE DETECTED NONE DETECTED   Benzodiazepine, Ur Scrn NONE DETECTED NONE DETECTED   Methadone Scn, Ur NONE DETECTED NONE DETECTED    Comment: (NOTE) Tricyclics + metabolites, urine    Cutoff 1000 ng/mL Amphetamines + metabolites, urine  Cutoff 1000 ng/mL MDMA (Ecstasy), urine              Cutoff 500 ng/mL Cocaine Metabolite, urine          Cutoff 300 ng/mL Opiate + metabolites, urine        Cutoff 300 ng/mL Phencyclidine (PCP), urine         Cutoff 25 ng/mL Cannabinoid, urine                 Cutoff 50 ng/mL Barbiturates + metabolites, urine  Cutoff 200 ng/mL Benzodiazepine, urine              Cutoff 200 ng/mL Methadone, urine                   Cutoff 300 ng/mL  The urine drug screen provides only a preliminary, unconfirmed analytical test result and should not be used for non-medical purposes. Clinical consideration and professional judgment should be applied to any positive drug screen result due to possible interfering substances. A more specific alternate chemical method must be  used in order to obtain a confirmed analytical result. Gas chromatography / mass spectrometry (GC/MS) is the preferred confirm atory method. Performed at [facility], [address] [place], [place] [address]   Valproic acid level     Status: None   Collection Time: 09/26/22  7:31 PM  Result Value Ref Range   Valproic Acid Lvl 70 50.0 - 100.0 ug/mL    Comment: Performed at [facility], [address] [place], [place] [address]    Blood Alcohol level:  Lab Results  Component Value Date   [facility] <10 09/26/2022    Physical Findings: AIMS:  , ,  ,  ,  CIWA:    COWS:     Musculoskeletal: Strength & Muscle Tone: within normal limits Gait & Station: normal Patient leans: N/A  Psychiatric Specialty Exam:  Presentation  General Appearance:  Appropriate for Environment  Eye Contact: Good  Speech: Clear and Coherent  Speech Volume: Normal  Handedness: Right   Mood and Affect  Mood: Euthymic  Affect: Appropriate; Congruent   Thought Process  Thought Processes: Coherent  Descriptions of Associations:Intact  Orientation:Full (Time, Place and Person)  Thought Content:WDL  History of Schizophrenia/Schizoaffective disorder:Yes  Duration of Psychotic Symptoms:No data recorded Hallucinations:Hallucinations: None  Ideas of Reference:None  Suicidal Thoughts:Suicidal Thoughts: No  Homicidal Thoughts:Homicidal Thoughts: No   Sensorium  Memory: Immediate Good; Recent Fair  Judgment: Fair  Insight: Fair   Art therapist  Concentration: Fair  Attention Span: Fair  Recall: [employer] of Knowledge: Fair  Language: Fair   Psychomotor Activity  Psychomotor Activity: Psychomotor Activity: Normal   Assets  Assets: Communication Skills; Leisure Time; Physical Health; Resilience; Vocational/Educational   Sleep  Sleep: Sleep: Good Number of Hours of Sleep: 8    Physical Exam: Physical Exam Vitals and  nursing note reviewed.  HENT:     Head: Normocephalic.     Nose: No congestion or rhinorrhea.  Eyes:     General:        Right eye: No discharge.        Left eye: No discharge.  Cardiovascular:     Rate and Rhythm: Normal rate.  Pulmonary:     Effort: Pulmonary effort is normal.  Musculoskeletal:        General: Normal range of motion.     Cervical back: Normal range of motion.  Skin:    General: Skin is dry.  Neurological:     Mental Status: He is alert and oriented to person, place, and time.  Psychiatric:        Attention and Perception: Attention normal.        Mood and Affect: Mood normal.        Speech: Speech normal.        Behavior: Behavior normal.        Thought Content: Thought content is not paranoid or delusional. Thought content does not include homicidal or suicidal ideation.        Cognition and Memory: Cognition is impaired (impaired at baseline).        Judgment: Judgment normal.    Review of Systems  Constitutional: Negative.   HENT: Negative.    Skin: Negative.    Blood pressure 106/75, pulse 92, temperature 98.2 F (36.8 C), resp. rate 14, height 5\' 5"  (1.651 m), weight 81.6 kg, SpO2 94 %. Body mass index is 29.95 kg/m.  Treatment Plan Summary: Patient does not meet criteria for inpatient psychiatric hospitalization. He does not appear to be a danger to himself or other presently and his IVC is being released. Group home to follow up with patient's outpatient providers. Reviewed with Dr. , EDP  Erma Heritage, NP 09/27/2022, 9:26 AM

## 2022-09-27 NOTE — ED Provider Notes (Signed)
Emergency Medicine Observation Re-evaluation Note  Richard Abbott is a 56 y.o. male, seen on rounds today.  Pt initially presented to the ED for complaints of Psychiatric Evaluation   Physical Exam  BP 126/86 (BP Location: Left Arm)   Pulse 77   Temp 98.4 F (36.9 C) (Oral)   Resp 18   Ht 5\' 5"  (1.651 m)   Wt 81.6 kg   SpO2 100%   BMI 29.95 kg/m  Physical Exam General: sitting in hallway bed, no distress Lungs: no increased wob Psych: calm  ED Course / MDM  EKG:   I have reviewed the labs performed to date as well as medications administered while in observation.  Recent changes in the last 24 hours include patient seen and evaluated by psychiatry who recommended inpatient admission..  Plan  Current plan is for inpatient psych admission patient is under full IVC at this time. , MD 09/27/22 [phone]

## 2022-09-27 NOTE — ED Notes (Signed)
Spoke with Meyer Cory, Supervisor with group home and gave report. Explained pt will be returning with [facility].

## 2022-09-27 NOTE — ED Provider Notes (Signed)
IVC rescinded. Pt will go back to group home who has agreed. He is calm and cooperative. Labs reassuring. Stable on exam in NAD.   Shaune Pollack, MD 09/27/22 1034

## 2022-09-27 NOTE — ED Notes (Signed)
IVC rescinded per NP Barthold 

## 2022-09-27 NOTE — ED Notes (Signed)
Discharged via [employer]. Sent with all of belongings. Paperwork sent with staff. Alcario Drought, supervisor aware of patient returning.

## 2022-11-15 ENCOUNTER — Ambulatory Visit: Payer: Medicare Other | Admitting: Podiatry

## 2022-11-29 ENCOUNTER — Ambulatory Visit: Payer: Medicare Other | Admitting: Podiatry

## 2022-12-13 ENCOUNTER — Ambulatory Visit (INDEPENDENT_AMBULATORY_CARE_PROVIDER_SITE_OTHER): Payer: Medicare Other | Admitting: Podiatry

## 2022-12-13 ENCOUNTER — Encounter: Payer: Self-pay | Admitting: Podiatry

## 2022-12-13 VITALS — BP 118/75 | HR 75

## 2022-12-13 DIAGNOSIS — L84 Corns and callosities: Secondary | ICD-10-CM

## 2022-12-13 DIAGNOSIS — M79675 Pain in left toe(s): Secondary | ICD-10-CM

## 2022-12-13 DIAGNOSIS — E119 Type 2 diabetes mellitus without complications: Secondary | ICD-10-CM | POA: Diagnosis not present

## 2022-12-13 DIAGNOSIS — B351 Tinea unguium: Secondary | ICD-10-CM | POA: Diagnosis not present

## 2022-12-13 DIAGNOSIS — M79674 Pain in right toe(s): Secondary | ICD-10-CM

## 2022-12-13 NOTE — Progress Notes (Signed)
This patient returns to my office for at risk foot care.  This patient requires this care by a professional since this patient will be at risk due to having diabetes type 2.  This patient is unable to cut nails himself since the patient cannot reach his nails.These nails are painful walking and wearing shoes. Patient has painful callus both feet. This patient presents for at risk foot care today.  General Appearance  Alert, conversant and in no acute stress.  Vascular  Dorsalis pedis and posterior tibial  pulses are palpable  bilaterally.  Capillary return is within normal limits  bilaterally. Temperature is within normal limits  bilaterally.  Neurologic  Senn-Weinstein monofilament wire test within normal limits  bilaterally. Muscle power within normal limits bilaterally.  Nails Thick disfigured discolored nails with subungual debris 1-5  B/L.   No evidence of bacterial infection or drainage bilaterally.  Orthopedic  No limitations of motion  feet .  No crepitus or effusions noted.  No bony pathology or digital deformities noted.  IPJ DJD right hallux.  FHL 1st MPJ right foot.  Skin  normotropic skin with no porokeratosis noted bilaterally.  No signs of infections or ulcers noted.   Pinch callus right.  Callus sub left hallux and sub 5th met left foot.  Onychomycosis  Pain in right toes  Pain in left toes  Pinch callus  B/L.  Consent was obtained for treatment procedures.   Mechanical debridement of nails 1-5  bilaterally performed with a nail nipper.  Filed with dremel without incident. Debride pinch callus  B/L  with # 15 blade. Padding dispensed.   Return office visit    3 months                 Told patient to return for periodic foot care and evaluation due to potential at risk complications.   Lakea Mittelman DPM  

## 2023-03-15 ENCOUNTER — Ambulatory Visit: Payer: Medicare Other | Admitting: Podiatry

## 2023-04-15 ENCOUNTER — Ambulatory Visit: Payer: Medicare Other | Admitting: Podiatry

## 2023-04-23 ENCOUNTER — Encounter (HOSPITAL_COMMUNITY): Payer: Self-pay

## 2023-04-23 ENCOUNTER — Emergency Department (HOSPITAL_COMMUNITY)
Admission: EM | Admit: 2023-04-23 | Discharge: 2023-04-23 | Disposition: A | Discharge status: Home / Self Care | Payer: Medicare Other | Attending: Emergency Medicine | Admitting: Emergency Medicine

## 2023-04-23 ENCOUNTER — Other Ambulatory Visit: Payer: Self-pay

## 2023-04-23 DIAGNOSIS — L84 Corns and callosities: Secondary | ICD-10-CM | POA: Insufficient documentation

## 2023-04-23 NOTE — ED Notes (Signed)
Left message for Cicero Duck for ETA on ride

## 2023-04-23 NOTE — ED Provider Notes (Signed)
[place] EMERGENCY DEPARTMENT AT [facility] Provider Note   CSN: [id number] Arrival date & time: 04/23/23  1111     History  Chief Complaint  Patient presents with   Foot Pain    Pt has growths on both feet Has podiatrist has not followed up Last seen 4 months ago    Richard Abbott is a 57 y.o. male.   Foot Pain  Patient presents with bilateral corns on his feet, chronic, establishes a complaint of having burning going on for months, no other complaints, has seen a podiatrist in the past     Home Medications Prior to Admission medications   Medication Sig Start Date End Date Taking? Authorizing Provider  atorvastatin (LIPITOR) 20 MG tablet Take 1 tablet (20 mg total) by mouth daily. 03/27/18   Aliene Beams, MD  benztropine (COGENTIN) 1 MG tablet Take 1 mg by mouth 3 (three) times daily.    [provider]  benztropine (COGENTIN) 2 MG tablet Take 2 mg by mouth every 8 (eight) hours as needed (EPS).     [provider]  Cholecalciferol 1000 units capsule 1 cap every am  Patient taking differently: Take 1,000 Units by mouth daily. 07/18/18   Aliene Beams, MD  DIABETIC TUSSIN EX 100 MG/5ML liquid TAKE 10 MLS BY MOUTH 3 TIMES DAILY AS NEEDED FOR COUCH. Patient taking differently: Take 10 mLs by mouth 3 (three) times daily as needed for cough. 12/12/17   Aliene Beams, MD  divalproex (DEPAKOTE) 500 MG DR tablet Take 500 mg by mouth every morning.    [provider]  divalproex (DEPAKOTE) 500 MG DR tablet Take 1,000 mg by mouth at bedtime.     [provider]  docusate sodium (COLACE) 100 MG capsule Take 1 capsule (100 mg total) by mouth 2 (two) times daily as needed for mild constipation. 07/30/18   Fields, Darleene Cleaver, MD  haloperidol decanoate (HALDOL DECANOATE) 100 MG/ML injection Inject 100 mg into the muscle every 28 (twenty-eight) days.    [provider]  hydrocortisone (ANUSOL-HC) 2.5 % rectal cream Place 1 application  rectally 2 (two) times daily as needed for hemorrhoids or anal itching. 05/06/18   Aliene Beams, MD  ibuprofen (ADVIL,MOTRIN) 200 MG tablet TAKE 2 TABLETS (400MG ) BY MOUTH EVERY 8 HOURS AS NEEDED FOR HEADACHE OR MILD PAIN 12/12/17   Aliene Beams, MD  linaclotide (LINZESS) 72 MCG capsule 1 PO DAILY PRN CONSTIPATION. Patient taking differently: Take 72 mcg by mouth daily as needed. 1 PO DAILY PRN CONSTIPATION. 11/06/18   Fields, Sandi L, MD  omeprazole (PRILOSEC) 20 MG capsule TAKE 1 CAPSULE BY MOUTH EACH MORNING AT 6:30AM Patient taking differently: Take 20 mg by mouth daily. 07/18/18   Aliene Beams, MD  phenylephrine-shark liver oil-mineral oil-petrolatum (PREPARATION H) 0.25-3-14-71.9 % rectal ointment 1 APPLICATION FOUR TIMES A DAY WHEN NEEDED TO REDUCE RECTAL PAIN/BLEEDING. 07/30/18   Fields, Darleene Cleaver, MD  sertraline (ZOLOFT) 100 MG tablet Take 100 mg by mouth every morning.    [provider]  Skin Protectants, Misc. (EUCERIN) cream Apply topically as needed for dry skin. 10/02/17   Aliene Beams, MD  Sunscreens (BLISTEX LIP BALM) 2-2.5-6.6 % STCK Apply to lips prn for chap/dry lips 10/02/17   Aliene Beams, MD  traZODone (DESYREL) 50 MG tablet Take 50 mg by mouth at bedtime. 09/07/22   [provider]  witch hazel-glycerin (TUCKS) pad Apply 1 application topically as needed for itching. 05/06/18   Aliene Beams, MD  Allergies    Chlorpromazine    Review of Systems   Review of Systems  All other systems reviewed and are negative.   Physical Exam Updated Vital Signs BP 97/70 (BP Location: Right Arm)   Pulse (!) 55   Temp 98.2 F (36.8 C) (Oral)   Resp 16   Ht 1.651 m (5\' 5" )   Wt 79.8 kg   SpO2 98%   BMI 29.29 kg/m  Physical Exam Vitals and nursing note reviewed.  Constitutional:      General: He is not in acute distress.    Appearance: He is well-developed.  HENT:     Head: Normocephalic and atraumatic.     Mouth/Throat:     Pharynx: No oropharyngeal  exudate.  Eyes:     General: No scleral icterus.       Right eye: No discharge.        Left eye: No discharge.     Conjunctiva/sclera: Conjunctivae normal.     Pupils: Pupils are equal, round, and reactive to light.  Neck:     Thyroid: No thyromegaly.     Vascular: No JVD.  Cardiovascular:     Rate and Rhythm: Normal rate and regular rhythm.     Heart sounds: Normal heart sounds. No murmur heard.    No friction rub. No gallop.  Pulmonary:     Effort: Pulmonary effort is normal. No respiratory distress.     Breath sounds: Normal breath sounds. No wheezing or rales.  Abdominal:     General: Bowel sounds are normal. There is no distension.     Palpations: Abdomen is soft. There is no mass.     Tenderness: There is no abdominal tenderness.  Musculoskeletal:        General: No tenderness. Normal range of motion.     Cervical back: Normal range of motion and neck supple.     Comments: Corns and calluses bilateral feet  Lymphadenopathy:     Cervical: No cervical adenopathy.  Skin:    General: Skin is warm and dry.     Findings: No erythema or rash.  Neurological:     Mental Status: He is alert.     Coordination: Coordination normal.  Psychiatric:        Behavior: Behavior normal.     ED Results / Procedures / Treatments   Labs (all labs ordered are listed, but only abnormal results are displayed) Labs Reviewed - No data to display  EKG None  Radiology No results found.  Procedures Procedures    Medications Ordered in ED Medications - No data to display  ED Course/ Medical Decision Making/ A&P                             Medical Decision Making  Nothing appears infected or swollen, chronic condition, no fever, well-appearing, stable for discharge, follow-up with podiatry or Derm        Final Clinical Impression(s) / ED Diagnoses Final diagnoses:  Corn of foot    Rx / DC Orders ED Discharge Orders     None         Eber Hong, MD 04/23/23  1148

## 2023-04-23 NOTE — ED Triage Notes (Signed)
Pt BIB [facility] from [facility] for burning sensation in bilat feet. Reports hx callouses, feels similar.

## 2023-04-23 NOTE — ED Notes (Signed)
Called Cicero Duck [phone] at [facility]  Will be here in 30 mins to pick pt up

## 2023-04-23 NOTE — Discharge Instructions (Signed)
You need to be seen by the dermatologist, call today to establish an appointment.  This is not something that is treated in the emergency department

## 2023-05-27 ENCOUNTER — Ambulatory Visit: Payer: Medicare Other | Admitting: Podiatry

## 2023-06-03 ENCOUNTER — Ambulatory Visit (INDEPENDENT_AMBULATORY_CARE_PROVIDER_SITE_OTHER): Payer: Medicare Other | Admitting: Podiatry

## 2023-06-03 DIAGNOSIS — M79674 Pain in right toe(s): Secondary | ICD-10-CM | POA: Diagnosis not present

## 2023-06-03 DIAGNOSIS — M79675 Pain in left toe(s): Secondary | ICD-10-CM | POA: Diagnosis not present

## 2023-06-03 DIAGNOSIS — E1169 Type 2 diabetes mellitus with other specified complication: Secondary | ICD-10-CM | POA: Diagnosis not present

## 2023-06-03 DIAGNOSIS — Q828 Other specified congenital malformations of skin: Secondary | ICD-10-CM | POA: Diagnosis not present

## 2023-06-03 DIAGNOSIS — B351 Tinea unguium: Secondary | ICD-10-CM | POA: Diagnosis not present

## 2023-06-03 NOTE — Progress Notes (Signed)
  Subjective:  Patient ID: Richard Abbott, male    DOB: 01/29/1966,  MRN: [id number]  Richard Abbott presents to clinic today for: preventative diabetic foot care and painful porokeratotic lesion(s) both feet and painful mycotic toenails that limit ambulation. Painful toenails interfere with ambulation. Aggravating factors include wearing enclosed shoe gear. Pain is relieved with periodic professional debridement. Painful porokeratotic lesions are aggravated when weightbearing with and without shoegear. Pain is relieved with periodic professional debridement.   PCP is Richard, Wayland Salinas, MD.  Allergies  Allergen Reactions   Chlorpromazine Other (See Comments)    Jitter feeling    Review of Systems: Negative except as noted in the HPI.  Objective: No changes noted in today's physical examination. There were no vitals filed for this visit.  Richard Abbott is a pleasant 57 y.o. male in NAD. AAO x 3.  Vascular Examination: Capillary refill time <3 seconds b/l LE. Palpable pedal pulses b/l LE. Digital hair present b/l. No pedal edema b/l. Skin temperature gradient WNL b/l. No varicosities b/l. No ischemia or gangrene noted b/l LE. No cyanosis or clubbing noted b/l LE.Marland Kitchen  Dermatological Examination: Pedal skin with normal turgor, texture and tone b/l. No open wounds. No interdigital macerations b/l. Toenails 1-5 b/l thickened, discolored, dystrophic with subungual debris. There is pain on palpation to dorsal aspect of nailplates. Porokeratotic lesion(s) R hallux, R 2nd toe, and submet head 5 b/l. No erythema, no edema, no drainage, no fluctuance..  Neurological Examination: Protective sensation intact with 10 gram monofilament b/l LE. Vibratory sensation intact b/l LE.   Musculoskeletal Examination: Muscle strength 5/5 to all lower extremity muscle groups bilaterally. Limited joint ROM to the DIPJ of bilateral great toes.  Assessment/Plan: 1. Pain due to onychomycosis of toenails of both  feet   2. Porokeratosis   3. Type 2 diabetes mellitus with other specified complication, without long-term current use of insulin [facility])      -Facility staff present with patient.  All questions/concerns addressed on today's visit. -Examined patient. -Medicaid ABN signed for services of paring of corn(s)/callus(es)/porokeratos(es) today. Copy in patient chart. -Patient to continue soft, supportive shoe gear daily. -Toenails 1-5 b/l were debrided in length and girth with sterile nail nippers and dremel without iatrogenic bleeding.  -Porokeratotic lesion(s) right great toe, R 2nd toe, and submet head 5 b/l pared and enucleated with sterile currette without incident. Total number of lesions debrided=4. -Patient/POA to call should there be question/concern in the interim.   Return in about 3 months (around 09/03/2023).  Freddie Breech, DPM

## 2023-06-12 ENCOUNTER — Encounter: Payer: Self-pay | Admitting: Podiatry

## 2023-09-02 ENCOUNTER — Ambulatory Visit (INDEPENDENT_AMBULATORY_CARE_PROVIDER_SITE_OTHER): Payer: Medicare Other | Admitting: Podiatry

## 2023-09-02 DIAGNOSIS — Z91199 Patient's noncompliance with other medical treatment and regimen due to unspecified reason: Secondary | ICD-10-CM

## 2023-09-02 NOTE — Progress Notes (Signed)
1. No-show for appointment     

## 2023-12-06 ENCOUNTER — Ambulatory Visit: Payer: Medicare Other | Admitting: Podiatry

## 2023-12-09 ENCOUNTER — Ambulatory Visit: Payer: Medicare Other | Admitting: Podiatry

## 2023-12-09 ENCOUNTER — Encounter: Payer: Self-pay | Admitting: Podiatry

## 2023-12-09 DIAGNOSIS — B351 Tinea unguium: Secondary | ICD-10-CM | POA: Diagnosis not present

## 2023-12-09 DIAGNOSIS — M79675 Pain in left toe(s): Secondary | ICD-10-CM

## 2023-12-09 DIAGNOSIS — E119 Type 2 diabetes mellitus without complications: Secondary | ICD-10-CM | POA: Diagnosis not present

## 2023-12-09 DIAGNOSIS — M79674 Pain in right toe(s): Secondary | ICD-10-CM | POA: Diagnosis not present

## 2023-12-09 DIAGNOSIS — E1169 Type 2 diabetes mellitus with other specified complication: Secondary | ICD-10-CM

## 2023-12-09 DIAGNOSIS — Q828 Other specified congenital malformations of skin: Secondary | ICD-10-CM

## 2023-12-09 DIAGNOSIS — L84 Corns and callosities: Secondary | ICD-10-CM

## 2023-12-09 NOTE — Progress Notes (Signed)
ANNUAL DIABETIC FOOT EXAM  Subjective: Richard Abbott presents today for annual diabetic foot exam. Chief Complaint  Patient presents with   Nail Problem    "Cut my toenails and calluses."   Patient confirms h/o diabetes.  Patient denies any h/o foot wounds.  Benetta Spar, MD is patient's PCP.  Past Medical History:  Diagnosis Date   Anxiety    Bipolar disorder (HCC)    GERD (gastroesophageal reflux disease)    Hypercholesteremia    Schizophrenia (HCC)    Type 2 diabetes mellitus (HCC)    Patient Active Problem List   Diagnosis Date Noted   Pain due to onychomycosis of toenails of both feet 05/05/2019   Callus of foot 05/05/2019   Constipation 07/30/2018   Internal and external bleeding hemorrhoids 07/30/2018   Morbid obesity (HCC) 02/04/2018   Type 2 diabetes mellitus with other specified complication (HCC) 02/04/2018   Normocytic anemia 01/15/2018   Bronchitis 12/05/2017   Cognitive dysfunction 11/14/2017   Schizophrenia (HCC) 07/29/2017   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   Past Surgical History:  Procedure Laterality Date   BIOPSY  02/11/2018   Procedure: BIOPSY;  Surgeon: West Bali, MD;  [facility];  Service: Endoscopy;;  duodenal biopsy and gastric biopsy   COLONOSCOPY WITH PROPOFOL N/A 02/11/2018   Procedure: COLONOSCOPY WITH PROPOFOL;  Surgeon: West Bali, MD;  [facility]: [facility];  Service: Endoscopy;  Laterality: N/A;  1:00pm   ESOPHAGOGASTRODUODENOSCOPY (EGD) WITH PROPOFOL N/A 02/11/2018   Procedure: ESOPHAGOGASTRODUODENOSCOPY (EGD) WITH PROPOFOL;  Surgeon: West Bali, MD;  [facility]: [facility];  Service: Endoscopy;  Laterality: N/A;   LACERATION REPAIR Right    Arm   POLYPECTOMY  02/11/2018   Procedure: POLYPECTOMY;  Surgeon: West Bali, MD;  Location: [facility];  Service: Endoscopy;;  cecal polyp cb,   Current Outpatient Medications on File Prior to Visit  Medication Sig Dispense Refill    atorvastatin (LIPITOR) 20 MG tablet Take 1 tablet (20 mg total) by mouth daily. 90 tablet 0   benztropine (COGENTIN) 1 MG tablet Take 1 mg by mouth 3 (three) times daily.     benztropine (COGENTIN) 2 MG tablet Take 2 mg by mouth every 8 (eight) hours as needed (EPS).      Cholecalciferol 1000 units capsule 1 cap every am  (Patient taking differently: Take 1,000 Units by mouth daily.) 30 capsule 11   DIABETIC TUSSIN EX 100 MG/5ML liquid TAKE 10 MLS BY MOUTH 3 TIMES DAILY AS NEEDED FOR COUCH. (Patient taking differently: Take 10 mLs by mouth 3 (three) times daily as needed for cough.) 118 mL 11   divalproex (DEPAKOTE) 500 MG DR tablet Take 500 mg by mouth every morning.     divalproex (DEPAKOTE) 500 MG DR tablet Take 1,000 mg by mouth at bedtime.      docusate sodium (COLACE) 100 MG capsule Take 1 capsule (100 mg total) by mouth 2 (two) times daily as needed for mild constipation. 90 capsule 1   haloperidol decanoate (HALDOL DECANOATE) 100 MG/ML injection Inject 100 mg into the muscle every 28 (twenty-eight) days.     hydrocortisone (ANUSOL-HC) 2.5 % rectal cream Place 1 application rectally 2 (two) times daily as needed for hemorrhoids or anal itching. 30 g 0   ibuprofen (ADVIL,MOTRIN) 200 MG tablet TAKE 2 TABLETS (400MG ) BY MOUTH EVERY 8 HOURS AS NEEDED FOR HEADACHE OR MILD PAIN 60 tablet 11   linaclotide (LINZESS) 72 MCG capsule 1 PO  DAILY PRN CONSTIPATION. (Patient taking differently: Take 72 mcg by mouth daily as needed. 1 PO DAILY PRN CONSTIPATION.) 30 capsule 11   omeprazole (PRILOSEC) 20 MG capsule TAKE 1 CAPSULE BY MOUTH EACH MORNING AT 6:30AM (Patient taking differently: Take 20 mg by mouth daily.) 30 capsule 11   phenylephrine-shark liver oil-mineral oil-petrolatum (PREPARATION H) 0.25-3-14-71.9 % rectal ointment 1 APPLICATION FOUR TIMES A DAY WHEN NEEDED TO REDUCE RECTAL PAIN/BLEEDING. 30 g 11   sertraline (ZOLOFT) 100 MG tablet Take 100 mg by mouth every morning.     Skin Protectants,  Misc. (EUCERIN) cream Apply topically as needed for dry skin. 454 g 2   Sunscreens (BLISTEX LIP BALM) 2-2.5-6.6 % STCK Apply to lips prn for chap/dry lips 1 each 11   traZODone (DESYREL) 50 MG tablet Take 50 mg by mouth at bedtime.     witch hazel-glycerin (TUCKS) pad Apply 1 application topically as needed for itching. 40 each 12   No current facility-administered medications on file prior to visit.    Allergies  Allergen Reactions   Chlorpromazine Other (See Comments)    Jitter feeling   Social History   Occupational History   Not on file  Tobacco Use   Smoking status: Every Day    Current packs/day: 0.25    Average packs/day: 0.3 packs/day for 20.0 years (5.0 ttl pk-yrs)    Types: Cigarettes, Cigars   Smokeless tobacco: Never   Tobacco comments:    Patient unsure of date where he began smoking but has been smoking for many years.  Vaping Use   Vaping status: Never Used  Substance and Sexual Activity   Alcohol use: No    Comment: Prior history of alcohol abuse   Drug use: No    Comment: Prior history of drug abuse   Sexual activity: Not Currently    Partners: Male    Comment: Denies that he is currently sexually active   Family History  Problem Relation Age of Onset   Colon cancer Neg Hx    Colon polyps Neg Hx    Immunization History  Administered Date(s) Administered   Influenza,inj,Quad PF,6+ Mos 10/02/2017   Influenza-Unspecified 10/02/2017   PFIZER Comirnaty(Gray Top)Covid-19 Tri-Sucrose Vaccine 01/21/2020, 02/13/2020   Pneumococcal Polysaccharide-23 02/04/2018   Tdap 02/04/2018     Review of Systems: Negative except as noted in the HPI.   Objective: There were no vitals filed for this visit.  Rami Budhu is a pleasant 58 y.o. male in NAD. AAO X 3.  Title   Diabetic Foot Exam - detailed Date & Time: 12/09/2023  8:20 AM Diabetic Foot exam was performed with the following findings: Yes  Visual Foot Exam completed.: Yes  Is there a history of foot  ulcer?: No Is there a foot ulcer now?: No Is there swelling?: No Is there elevated skin temperature?: No Is there abnormal foot shape?: Yes Is there a claw toe deformity?: No Are the toenails long?: Yes Are the toenails thick?: Yes Are the toenails ingrown?: No Is the skin thin, fragile, shiny and hairless?": No Normal Range of Motion?: Yes Is there foot or ankle muscle weakness?: No Do you have pain in calf while walking?: No Are the shoes appropriate in style and fit?: Yes Can the patient see the bottom of their feet?: No Pulse Foot Exam completed.: Yes   Right Posterior Tibialis: Present Left posterior Tibialis: Present   Right Dorsalis Pedis: Present Left Dorsalis Pedis: Present     Sensory Foot Exam Completed.: Yes  Semmes-Weinstein Monofilament Test "+" means "has sensation" and "-" means "no sensation"  R Foot Test Control: Pos L Foot Test Control: Pos   R Site 1-Great Toe: Pos L Site 1-Great Toe: Pos   R Site 4: Pos L Site 4: Pos   R site 5: Pos L Site 5: Pos  R Site 6: Pos L Site 6: Pos     Image components are not supported.   Image components are not supported. Image components are not supported.  T[place] Right vibratory: present Left vibratory: present  Comments Hyperkeratotic lesion(s) submet head 1 right foot and submet head 5 right foot.  No erythema, no edema, no drainage, no fluctuance. Porokeratotic lesion(s) bilateral great toes, R 2nd toe, and submet head 5 left foot. No erythema, no edema, no drainage, no fluctuance.      Lab Results  Component Value Date   HGBA1C 6.4 (H) 06/26/2018   ADA Risk Categorization: Low Risk :  Patient has all of the following: Intact protective sensation No prior foot ulcer  No severe deformity Pedal pulses present  Assessment: 1. Pain due to onychomycosis of toenails of both feet   2. Porokeratosis   3. Callus of foot   4. Type 2 diabetes mellitus with other specified complication, without long-term  current use of insulin (HCC)   5. Encounter for diabetic foot exam (HCC)     Plan: Diabetic foot examination performed today.  All patient's and/or POA's questions/concerns addressed on today's visit. Toenails 1-5 debrided in length and girth without incident. Porokeratotic lesion(s) bilateral great toes, R 2nd toe, and submet head 5 left foot  and calluses submet head 5 right foot and submet head 1 right foot pared with sharp debridement without incident. Continue daily foot inspections and monitor blood glucose per PCP/Endocrinologist's recommendations. Continue soft, supportive shoe gear daily. Report any pedal injuries to medical professional. Call office if there are any questions/concerns. -Patient/POA to call should there be question/concern in the interim. Return in about 3 months (around 03/07/2024).  Freddie Breech, DPM      [facility] LOCATION: [address] [place] [address]                   Office [phone]   [facility] LOCATION: [address] [place], [place] [address] Office [phone]

## 2023-12-15 ENCOUNTER — Encounter: Payer: Self-pay | Admitting: Podiatry

## 2024-05-15 ENCOUNTER — Ambulatory Visit (INDEPENDENT_AMBULATORY_CARE_PROVIDER_SITE_OTHER): Admitting: Podiatry

## 2024-05-15 ENCOUNTER — Encounter: Payer: Self-pay | Admitting: Podiatry

## 2024-05-15 DIAGNOSIS — E1169 Type 2 diabetes mellitus with other specified complication: Secondary | ICD-10-CM

## 2024-05-15 DIAGNOSIS — M79674 Pain in right toe(s): Secondary | ICD-10-CM | POA: Diagnosis not present

## 2024-05-15 DIAGNOSIS — Q828 Other specified congenital malformations of skin: Secondary | ICD-10-CM

## 2024-05-15 DIAGNOSIS — M79675 Pain in left toe(s): Secondary | ICD-10-CM | POA: Diagnosis not present

## 2024-05-15 DIAGNOSIS — L84 Corns and callosities: Secondary | ICD-10-CM

## 2024-05-15 DIAGNOSIS — B351 Tinea unguium: Secondary | ICD-10-CM

## 2024-05-21 NOTE — Progress Notes (Signed)
 Subjective:  Patient ID: Richard Abbott, male    DOB: 11-06-1965,  MRN: [id number]  Richard Abbott presents to clinic today for preventative diabetic foot care and callus(es) right foot, porokeratotic lesion(s) of both feet, and painful mycotic nails. Painful toenails interfere with ambulation. Aggravating factors include wearing enclosed shoe gear. Pain is relieved with periodic professional debridement. Painful callus(es) and porokeratotic lesion(s) are aggravated when weightbearing with and without shoegear. Pain is relieved with periodic professional debridement.  Chief Complaint  Patient presents with   RFC    Rm2 Diabetic/A1c  6.0/ DR. Fanta    New problem(s): None.   PCP is Fanta, Benita Area, MD.  Allergies  Allergen Reactions   Chlorpromazine Other (See Comments)    Jitter feeling    Review of Systems: Negative except as noted in the HPI.  Objective: No changes noted in today's physical examination. There were no vitals filed for this visit. Richard Abbott is a pleasant 58 y.o. male WD, WN in NAD. AAO x 3.  Vascular Examination: Capillary refill time immediate b/l. Palpable pedal pulses. Pedal hair present b/l. No pain with calf compression b/l. Skin temperature gradient WNL b/l. No cyanosis or clubbing b/l. No ischemia or gangrene noted b/l. No edema noted b/l LE.  Neurological Examination: Sensation grossly intact b/l with 10 gram monofilament. Vibratory sensation intact b/l.   Dermatological Examination: Pedal skin with normal turgor, texture and tone b/l.  No open wounds. No interdigital macerations.   Toenails 1-5 b/l thick, discolored, elongated with subungual debris and pain on dorsal palpation.   Pedal skin is warm and supple b/l LE. No open wounds b/l LE. No interdigital macerations noted b/l LE. Toenails 1-5 b/l elongated, discolored, dystrophic, thickened, crumbly with subungual debris and tenderness to dorsal palpation. Porokeratotic lesion(s) medial IPJ of  left great toe, medial IPJ of right great toe, and submet head 5 b/l. No erythema, no edema, no drainage, no fluctuance. Preulcerative lesion noted submet head 1 right foot. There is visible subdermal hemorrhage. There is no surrounding erythema, no edema, no drainage, no odor, no fluctuance.  Musculoskeletal Examination: Muscle strength 5/5 to all lower extremity muscle groups bilaterally. Hammertoe deformity noted 2-5 b/l.SABRA No pain, crepitus or joint limitation noted with ROM b/l LE.  Patient ambulates independently without assistive aids.  Radiographs: None  Assessment/Plan: 1. Pain due to onychomycosis of toenails of both feet   2. Porokeratosis   3. Pre-ulcerative calluses   4. Type 2 diabetes mellitus with other specified complication, without long-term current use of insulin (HCC)     -Patient was evaluated today. All questions/concerns addressed on today's visit. -Caregiver/provider present with patient on today's visit. -Patient to continue soft, supportive shoe gear daily. -Toenails 1-5 b/l were debrided in length and girth with sterile nail nippers and dremel without iatrogenic bleeding.  -Preulcerative lesion pared submet head 1 right foot utilizing sterile scalpel blade. Total number pared=1. -Porokeratotic lesion(s) medial IPJ of left great toe, medial IPJ of right great toe, and submet head 5 b/l pared and enucleated with sterile currette without incident. Total number of lesions debrided=4. -Patient/POA to call should there be question/concern in the interim.   Return in about 3 months (around 08/15/2024).  Richard Abbott, DPM      [facility] LOCATION: [address]  [place], [place] [address]                   Office [phone]   [facility] LOCATION: [address] [place], [place] [address] Office [phone]

## 2024-07-30 ENCOUNTER — Other Ambulatory Visit: Payer: Self-pay

## 2024-07-30 ENCOUNTER — Emergency Department
Admission: EM | Admit: 2024-07-30 | Discharge: 2024-08-01 | Disposition: A | Discharge status: Other Acute Inpatient Hospital | Attending: Emergency Medicine | Admitting: Emergency Medicine

## 2024-07-30 DIAGNOSIS — Z79899 Other long term (current) drug therapy: Secondary | ICD-10-CM | POA: Diagnosis not present

## 2024-07-30 DIAGNOSIS — F209 Schizophrenia, unspecified: Secondary | ICD-10-CM | POA: Diagnosis not present

## 2024-07-30 DIAGNOSIS — R456 Violent behavior: Secondary | ICD-10-CM | POA: Diagnosis present

## 2024-07-30 DIAGNOSIS — F71 Moderate intellectual disabilities: Secondary | ICD-10-CM | POA: Diagnosis present

## 2024-07-30 DIAGNOSIS — F29 Unspecified psychosis not due to a substance or known physiological condition: Secondary | ICD-10-CM | POA: Insufficient documentation

## 2024-07-30 LAB — COMPREHENSIVE METABOLIC PANEL WITH GFR
ALT: 28 U/L (ref 0–44)
AST: 50 U/L — ABNORMAL HIGH (ref 15–41)
Albumin: 3.7 g/dL (ref 3.5–5.0)
Alkaline Phosphatase: 56 U/L (ref 38–126)
Anion gap: 8 (ref 5–15)
BUN: 13 mg/dL (ref 6–20)
CO2: 27 mmol/L (ref 22–32)
Calcium: 8.9 mg/dL (ref 8.9–10.3)
Chloride: 104 mmol/L (ref 98–111)
Creatinine, Ser: 0.89 mg/dL (ref 0.61–1.24)
GFR, Estimated: >60 mL/min (ref >60)
Glucose, Bld: 71 mg/dL (ref 70–99)
Potassium: 4.6 mmol/L (ref 3.5–5.1)
Sodium: 139 mmol/L (ref 135–145)
Total Bilirubin: 0.4 mg/dL (ref 0.0–1.2)
Total Protein: 7.6 g/dL (ref 6.5–8.1)

## 2024-07-30 LAB — URINE DRUG SCREEN, QUALITATIVE (ARMC ONLY)
Amphetamines, Ur Screen: NOT DETECTED
Barbiturates, Ur Screen: NOT DETECTED
Benzodiazepine, Ur Scrn: NOT DETECTED
Cannabinoid 50 Ng, Ur ~~LOC~~: NOT DETECTED
Cocaine Metabolite,Ur ~~LOC~~: NOT DETECTED
MDMA (Ecstasy)Ur Screen: NOT DETECTED
Methadone Scn, Ur: NOT DETECTED
Opiate, Ur Screen: NOT DETECTED
Phencyclidine (PCP) Ur S: NOT DETECTED
Tricyclic, Ur Screen: NOT DETECTED

## 2024-07-30 LAB — ETHANOL: Alcohol, Ethyl (B): <15 mg/dL (ref <15)

## 2024-07-30 LAB — CBC
HCT: 39.2 % (ref 39.0–52.0)
Hemoglobin: 13.1 g/dL (ref 13.0–17.0)
MCH: 31.6 pg (ref 26.0–34.0)
MCHC: 33.4 g/dL (ref 30.0–36.0)
MCV: 94.5 fL (ref 80.0–100.0)
Platelets: 223 K/uL (ref 150–400)
RBC: 4.15 MIL/uL — ABNORMAL LOW (ref 4.22–5.81)
RDW: 14.6 % (ref 11.5–15.5)
WBC: 4.4 K/uL (ref 4.0–10.5)
nRBC: 0 % (ref 0.0–0.2)

## 2024-07-30 LAB — VALPROIC ACID LEVEL: Valproic Acid Lvl: 104 ug/mL — ABNORMAL HIGH (ref 50–100)

## 2024-07-30 MED ORDER — SERTRALINE HCL 50 MG PO TABS
100.0000 mg | ORAL_TABLET | ORAL | Status: DC
  Administered 2024-07-31 – 2024-08-01 (×2): 100 mg via ORAL
  Filled 2024-07-30 (×2): qty 2

## 2024-07-30 MED ORDER — PANTOPRAZOLE SODIUM 40 MG PO TBEC
80.0000 mg | DELAYED_RELEASE_TABLET | Freq: Every day | ORAL | Status: DC
  Administered 2024-07-31: 80 mg via ORAL
  Filled 2024-07-30: qty 2

## 2024-07-30 MED ORDER — DIVALPROEX SODIUM 500 MG PO DR TAB
500.0000 mg | DELAYED_RELEASE_TABLET | ORAL | Status: DC
  Administered 2024-07-31 – 2024-08-01 (×2): 500 mg via ORAL
  Filled 2024-07-30 (×2): qty 1

## 2024-07-30 MED ORDER — HALOPERIDOL DECANOATE 100 MG/ML IM SOLN
100.0000 mg | INTRAMUSCULAR | Status: DC
  Administered 2024-07-30: 100 mg via INTRAMUSCULAR
  Filled 2024-07-30: qty 1

## 2024-07-30 MED ORDER — VITAMIN D 25 MCG (1000 UNIT) PO TABS
1000.0000 [IU] | ORAL_TABLET | Freq: Every day | ORAL | Status: DC
  Administered 2024-07-31: 1000 [IU] via ORAL
  Filled 2024-07-30: qty 1

## 2024-07-30 MED ORDER — ATORVASTATIN CALCIUM 20 MG PO TABS
20.0000 mg | ORAL_TABLET | Freq: Every day | ORAL | Status: DC
  Administered 2024-07-30 – 2024-07-31 (×2): 20 mg via ORAL
  Filled 2024-07-30 (×2): qty 1

## 2024-07-30 MED ORDER — DIVALPROEX SODIUM 500 MG PO DR TAB
1000.0000 mg | DELAYED_RELEASE_TABLET | Freq: Every day | ORAL | Status: DC
  Administered 2024-07-30 – 2024-07-31 (×2): 1000 mg via ORAL
  Filled 2024-07-30 (×2): qty 2

## 2024-07-30 MED ORDER — BENZTROPINE MESYLATE 1 MG PO TABS
1.0000 mg | ORAL_TABLET | Freq: Three times a day (TID) | ORAL | Status: DC
  Administered 2024-07-30 – 2024-07-31 (×4): 1 mg via ORAL
  Filled 2024-07-30 (×4): qty 1

## 2024-07-30 MED ORDER — TRAZODONE HCL 50 MG PO TABS
50.0000 mg | ORAL_TABLET | Freq: Every day | ORAL | Status: DC
  Administered 2024-07-30 – 2024-07-31 (×2): 50 mg via ORAL
  Filled 2024-07-30 (×2): qty 1

## 2024-07-30 NOTE — ED Notes (Signed)
 Pt given dinner tray and coca cola at this time.

## 2024-07-30 NOTE — ED Notes (Signed)
 Snack given.
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
## 2024-07-30 NOTE — ED Notes (Addendum)
 Bag one: black puffer jacket, orange jacket, yellow beanie, silver ring, two silver chain necklaces and one gold necklace with cross Bag two: green long sleeved shirt, khaki pants, black and yellow tennis shoes, black socks, black belt, underwear, pack of cigarettes and a lighter, wallet, watch, chapstick
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
## 2024-07-30 NOTE — ED Notes (Signed)
 Pt received lunch tray

## 2024-07-30 NOTE — ED Notes (Signed)
IVC 
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
## 2024-07-30 NOTE — BH Assessment (Signed)
 19:41 Attempted to reach pt's group home [phone]) unable to leave a message, the voicemail box was full. Psych team to follow up.

## 2024-07-30 NOTE — BH Assessment (Signed)
 13.52-Called group home ([facility][phone]), unable to leave a message, the voicemail box was full.   13:53-Psych Team called and left a HIPPA Compliant message with Group Home manager Charolotte A. Humphrey-[phone]), requesting a return phone call.  13:53-Spoke with patient's guardian (Latoya Annson-[phone]), she was not aware patient was in the ER. Was unable to give details about what took place that led to patient coming to the ER. Obtained the PSR/Day Program number from the guardian   13:58-Called the PSR/Day Program [phone]) was unable to leave a voicemail message requesting a return phone call because to voicemail box was full.  14:30-Received phone call from patient's guardian (Latoya A.) stating she reached out to the group home and the day program but was unable to contact anyone. She obtained the information for the sheriff department who brought the patient in.

## 2024-07-31 DIAGNOSIS — F209 Schizophrenia, unspecified: Secondary | ICD-10-CM | POA: Diagnosis not present

## 2024-07-31 LAB — CBG MONITORING, ED: Glucose-Capillary: 108 mg/dL — ABNORMAL HIGH (ref 70–99)

## 2024-07-31 NOTE — ED Notes (Signed)
 IVC  GOING TO  [place]  [facility] ON 08/01/24

## 2024-07-31 NOTE — ED Notes (Signed)
 Hospital meal provided, pt tolerated w/o complaints.  Waste discarded appropriately.

## 2024-07-31 NOTE — BH Assessment (Signed)
 Patient has been accepted to [facility], PENDING documentation of diabetes intervention  Patient assigned to room 501 Accepting physician is Dr. Prentis.  Call report to [phone].  Representative was Tedi MOTE   ER Staff is aware of it:  Olam ORN., ER Secretary  Leonor BROCKS, Patient's Nurse     Patient's Legal Guardian Garald Annson-[phone]) have been updated as well and the Group Home [phone])  Address: [address]  [place], [place] [address]  Bed is available today but [facility] Department is unable to transport him until tomorrow (08/01/2024).

## 2024-07-31 NOTE — ED Provider Notes (Signed)
 Emergency Medicine Observation Re-evaluation Note  Richard Abbott is a 58 y.o. male, seen on rounds today.  Pt initially presented to the ED for complaints of Psychiatric Evaluation Currently, the patient is resting.  Physical Exam  BP 123/89   Pulse 62   Temp 99 F (37.2 C)   Resp 20   Ht 1.676 m (5' 6)   SpO2 97%   BMI 28.41 kg/m  Physical Exam Gen:  No acute distress Resp:  Breathing easily and comfortably, no accessory muscle usage Neuro:  Moving all four extremities, no gross focal neuro deficits Psych:  Resting currently, calm when awake  ED Course / MDM  EKG:   I have reviewed the labs performed to date as well as medications administered while in observation.  Recent changes in the last 24 hours include initial evaluation.  Plan  Current plan is for reassessment to obtain collateral information.    Gordan Huxley, MD 07/31/24 [phone]

## 2024-07-31 NOTE — BH Assessment (Signed)
 TTS attempted to contact Group Home, but was unable to reach anyone and leave a message requesting a return phone call because the mailbox was full.  Writer called and left a HIPPA Compliant message with Group Home [facility] [phone]), requesting a return phone call.
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
## 2024-07-31 NOTE — BH Assessment (Signed)
 06:18 Attempted to reach pt's group home [phone]) unable to leave a message, the voicemail box was full. Psych team to follow up.

## 2024-07-31 NOTE — ED Notes (Signed)
 ivc/pending consult & collateral information.

## 2024-07-31 NOTE — ED Provider Notes (Signed)
 Patient's latest blood sugar 108. Patient does not require further intervention of his diabetes at this time.   Floy Roberts, MD 07/31/24 [phone]

## 2024-07-31 NOTE — ED Notes (Signed)
 Given snack.

## 2024-07-31 NOTE — ED Notes (Addendum)
 Breakfast provided at the bedside with milk. Pt sitting up on side of bed. Talkative and alert at this time.

## 2024-07-31 NOTE — ED Notes (Signed)
 Lunch tray provided to pt.

## 2024-08-01 ENCOUNTER — Encounter (HOSPITAL_COMMUNITY): Payer: Self-pay

## 2024-08-01 ENCOUNTER — Other Ambulatory Visit: Payer: Self-pay

## 2024-08-01 ENCOUNTER — Inpatient Hospital Stay (HOSPITAL_COMMUNITY)
Admission: AD | Admit: 2024-08-01 | Discharge: 2024-08-06 | DRG: 885 | Disposition: A | Discharge status: Home / Self Care | Source: Intra-hospital | Attending: Student in an Organized Health Care Education/Training Program | Admitting: Student in an Organized Health Care Education/Training Program

## 2024-08-01 DIAGNOSIS — K219 Gastro-esophageal reflux disease without esophagitis: Secondary | ICD-10-CM | POA: Diagnosis present

## 2024-08-01 DIAGNOSIS — Z860100 Personal history of colon polyps, unspecified: Secondary | ICD-10-CM

## 2024-08-01 DIAGNOSIS — E119 Type 2 diabetes mellitus without complications: Secondary | ICD-10-CM | POA: Diagnosis present

## 2024-08-01 DIAGNOSIS — G4700 Insomnia, unspecified: Secondary | ICD-10-CM | POA: Diagnosis present

## 2024-08-01 DIAGNOSIS — F209 Schizophrenia, unspecified: Secondary | ICD-10-CM | POA: Diagnosis present

## 2024-08-01 DIAGNOSIS — E7800 Pure hypercholesterolemia, unspecified: Secondary | ICD-10-CM | POA: Diagnosis present

## 2024-08-01 DIAGNOSIS — Z79899 Other long term (current) drug therapy: Secondary | ICD-10-CM

## 2024-08-01 DIAGNOSIS — F319 Bipolar disorder, unspecified: Secondary | ICD-10-CM | POA: Diagnosis present

## 2024-08-01 DIAGNOSIS — F17210 Nicotine dependence, cigarettes, uncomplicated: Secondary | ICD-10-CM | POA: Diagnosis present

## 2024-08-01 MED ORDER — LORAZEPAM 2 MG/ML IJ SOLN: 2.0000 mg | Freq: Three times a day (TID) | INTRAMUSCULAR | Status: DC | PRN

## 2024-08-01 MED ORDER — PANTOPRAZOLE SODIUM 40 MG PO TBEC
80.0000 mg | DELAYED_RELEASE_TABLET | Freq: Every day | ORAL | Status: DC
  Administered 2024-08-01 – 2024-08-06 (×6): 80 mg via ORAL
  Filled 2024-08-01 (×6): qty 2

## 2024-08-01 MED ORDER — HALOPERIDOL LACTATE 5 MG/ML IJ SOLN: 10.0000 mg | Freq: Three times a day (TID) | INTRAMUSCULAR | Status: DC | PRN

## 2024-08-01 MED ORDER — BENZTROPINE MESYLATE 1 MG PO TABS
1.0000 mg | ORAL_TABLET | Freq: Three times a day (TID) | ORAL | Status: DC
Start: 2024-08-01 — End: 2024-08-06
  Administered 2024-08-01 – 2024-08-06 (×15): 1 mg via ORAL
  Filled 2024-08-01 (×14): qty 1

## 2024-08-01 MED ORDER — VITAMIN D 25 MCG (1000 UNIT) PO TABS
1000.0000 [IU] | ORAL_TABLET | Freq: Every day | ORAL | Status: DC
  Administered 2024-08-01 – 2024-08-06 (×6): 1000 [IU] via ORAL
  Filled 2024-08-01 (×6): qty 1

## 2024-08-01 MED ORDER — ALUM & MAG HYDROXIDE-SIMETH 200-200-20 MG/5ML PO SUSP: 30.0000 mL | ORAL | Status: DC | PRN

## 2024-08-01 MED ORDER — TRAZODONE HCL 50 MG PO TABS
50.0000 mg | ORAL_TABLET | Freq: Every day | ORAL | Status: DC
  Administered 2024-08-01 – 2024-08-05 (×5): 50 mg via ORAL
  Filled 2024-08-01 (×5): qty 1

## 2024-08-01 MED ORDER — ATORVASTATIN CALCIUM 10 MG PO TABS
20.0000 mg | ORAL_TABLET | Freq: Every day | ORAL | Status: DC
Start: 2024-08-01 — End: 2024-08-06
  Administered 2024-08-01 – 2024-08-06 (×6): 20 mg via ORAL
  Filled 2024-08-01 (×6): qty 2

## 2024-08-01 MED ORDER — DIPHENHYDRAMINE HCL 25 MG PO CAPS: 50.0000 mg | ORAL_CAPSULE | Freq: Three times a day (TID) | ORAL | Status: DC | PRN

## 2024-08-01 MED ORDER — SERTRALINE HCL 100 MG PO TABS
100.0000 mg | ORAL_TABLET | ORAL | Status: DC
  Administered 2024-08-02 – 2024-08-06 (×5): 100 mg via ORAL
  Filled 2024-08-01: qty 1
  Filled 2024-08-01 (×3): qty 2
  Filled 2024-08-01: qty 1
  Filled 2024-08-01 (×3): qty 2

## 2024-08-01 MED ORDER — MAGNESIUM HYDROXIDE 400 MG/5ML PO SUSP: 30.0000 mL | Freq: Every day | ORAL | Status: DC | PRN

## 2024-08-01 MED ORDER — DIPHENHYDRAMINE HCL 50 MG/ML IJ SOLN: 50.0000 mg | Freq: Three times a day (TID) | INTRAMUSCULAR | Status: DC | PRN

## 2024-08-01 MED ORDER — DIVALPROEX SODIUM 500 MG PO DR TAB
500.0000 mg | DELAYED_RELEASE_TABLET | ORAL | Status: DC
Start: 2024-08-02 — End: 2024-08-02
  Administered 2024-08-02: 500 mg via ORAL
  Filled 2024-08-01: qty 1

## 2024-08-01 MED ORDER — HALOPERIDOL LACTATE 5 MG/ML IJ SOLN: 5.0000 mg | Freq: Three times a day (TID) | INTRAMUSCULAR | Status: DC | PRN

## 2024-08-01 MED ORDER — DIVALPROEX SODIUM 500 MG PO DR TAB
1000.0000 mg | DELAYED_RELEASE_TABLET | Freq: Every day | ORAL | Status: DC
  Administered 2024-08-01 – 2024-08-05 (×5): 1000 mg via ORAL
  Filled 2024-08-01 (×5): qty 2

## 2024-08-01 MED ORDER — HALOPERIDOL 5 MG PO TABS
10.0000 mg | ORAL_TABLET | Freq: Every day | ORAL | Status: DC
  Administered 2024-08-02: 10 mg via ORAL
  Filled 2024-08-01: qty 2

## 2024-08-01 MED ORDER — HALOPERIDOL 5 MG PO TABS: 5.0000 mg | ORAL_TABLET | Freq: Three times a day (TID) | ORAL | Status: DC | PRN

## 2024-08-01 MED ORDER — ACETAMINOPHEN 325 MG PO TABS: 650.0000 mg | ORAL_TABLET | Freq: Four times a day (QID) | ORAL | Status: DC | PRN

## 2024-08-01 NOTE — ED Notes (Signed)
 Patient to bathroom prior to transport
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
## 2024-08-01 NOTE — BHH Suicide Risk Assessment (Signed)
 [facility] Admission Suicide Risk Assessment   Nursing information obtained from:  Patient Demographic factors:  NA Current Mental Status:  NA Loss Factors:  NA Historical Factors:  Prior suicide attempts Risk Reduction Factors:  Positive coping skills or problem solving skills  Total Time spent with patient:: 1 Hour Principal Problem: Schizophrenia (HCC) Diagnosis:  Principal Problem:   Schizophrenia (HCC)   Subjective Data:  Richard Abbott is a 58 y.o., male with history of schizophrenia living in a group home is admitted to behavioral health hospital involuntarily due to worsening psychosis and making threats to hurt others.  Patient has a legal guardian through [facility] DHS (Latoya Annson).    Patient participates minimally during assessment but was pleasant throughout although will smile inappropriately at times. He reports he became very irritable as of late due to new tenants at the group home. He was unable to go into detail as to the circumstances that resulted in his hospitalization but does state that there was some type of dispute specifically related to one of the new tenants that caused him to feel angrier than he has ever been before.  He reports he is not normally angry but he was name called multiple different things and this for him was very frustrating.  He states that he has been at that group home for years and has not had any problems.  He reports his medications have largely been unchanged for the past few years which is consistent with med dispense history.  He denies any acute side effects or noncompliance in regards to psychotropic medications.   He reports hearing the diagnosis of schizophrenia.  He does not recall the last time he experienced any auditory or visual hallucinations.  He denies current SI/HI/AVH.  He denies history of mania.  He denies ever experiencing severe depression or anxiety.  He denies experiencing symptoms of anger out of proportion to  situation and denies ever being physically aggressive.  He was amenable for me to reach out to group home to clarify ongoing circumstances.  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 He denies recent changes in lifestyle reports eating and sleeping well.  He denies any illicit substance use, consistent with UDS.  Continued Clinical Symptoms:  Alcohol Use Disorder Identification Test Final Score (AUDIT): 0 The Alcohol Use Disorders Identification Test, Guidelines for Use in Primary Care, Second Edition.  World Science writer [facility]). Score between 0-7:  no or low risk or alcohol related problems. Score between 8-15:  moderate risk of alcohol related problems. Score between 16-19:  high risk of alcohol related problems. Score 20 or above:  warrants further diagnostic evaluation for alcohol dependence and treatment.   CLINICAL FACTORS:   Schizophrenia:   Paranoid or undifferentiated type More than one psychiatric diagnosis Previous Psychiatric Diagnoses and Treatments   Musculoskeletal: Strength & Muscle Tone: within normal limits Gait & Station: normal Patient leans: N/A  Psychiatric Specialty Exam:  Presentation  General Appearance: Appropriate for Environment; Casual   Eye Contact:Fair   Speech:Clear and Coherent; Normal Rate   Speech Volume:Normal   Handedness:No data recorded  Mood and Affect  Mood:Euthymic   Affect:Appropriate; Congruent (smiles inappropriately at times)    Thought Process  Thought Processes:Coherent; Goal Directed   Descriptions of Associations:Intact   Orientation:Full (Time, Place and Person)   Thought Content:Logical   History of Schizophrenia/Schizoaffective disorder:Yes   Duration of Psychotic Symptoms:No data recorded  Hallucinations:Hallucinations: None   Ideas of Reference:None   Suicidal Thoughts:Suicidal Thoughts:  No   Homicidal Thoughts:Homicidal Thoughts: No    Sensorium  Memory:Remote  Fair   Judgment:Poor   Insight:Poor    Executive Functions  Concentration:Fair   Attention Span:Fair   Recall:Fair   Fund of Knowledge:Fair   Language:Fair    Psychomotor Activity  Psychomotor Activity:Psychomotor Activity: Normal    Assets  Assets:Communication Skills; Physical Health; Resilience    Sleep  Sleep:Sleep: Good       COGNITIVE FEATURES THAT CONTRIBUTE TO RISK:  None    SUICIDE RISK:   Minimal: No identifiable suicidal ideation.  Patients presenting with no risk factors but with morbid ruminations; may be classified as minimal risk based on the severity of the depressive symptoms  PLAN OF CARE: see H&P  I certify that inpatient services furnished can reasonably be expected to improve the patient's condition.   Prentice Espy, MD 08/01/2024, 9:12 PM

## 2024-08-01 NOTE — ED Provider Notes (Signed)
 Emergency Medicine Observation Re-evaluation Note  Richard Abbott is a 58 y.o. male, seen on rounds today.  Pt initially presented to the ED for complaints of Psychiatric Evaluation  Currently, the patient is is no acute distress. Denies any concerns at this time.  Physical Exam  Blood pressure (!) 121/91, pulse 84, temperature 98.1 F (36.7 C), temperature source Oral, resp. rate 18, height 5' 6 (1.676 m), SpO2 95%.  Physical Exam: General: No apparent distress Pulm: Normal WOB Neuro: Moving all extremities Psych: Resting comfortably     ED Course / MDM   Clinical Course as of 08/01/24 0421  Thu Jul 30, 2024  1230 The patient has been placed in psychiatric observation due to the need to provide a safe environment for the patient while obtaining psychiatric consultation and evaluation, as well as ongoing medical and medication management to treat the patient's condition.  The patient has been placed under full IVC at this time.   [DS]  2106 Patient's home meds have been reviewed by pharmacy and entered into current orders [EB]    Clinical Course User Index [DS] Claudene Rover, MD [EB] Bradler, Evan K, MD    I have reviewed the labs performed to date as well as medications administered while in observation.  Recent changes in the last 24 hours include: No acute events overnight.  [facility].  Plan   Current plan: Patient awaiting inpatient psychiatric hospitalization Patient is under full IVC at this time.    Suzanne Kirsch, MD 08/01/24 [phone]

## 2024-08-01 NOTE — ED Notes (Signed)
EMTALA Reviewed by this RN at this time.

## 2024-08-01 NOTE — ED Notes (Addendum)
 Transport Management consultant) arrived. NT to bedside to obtain vitals prior to transport. Two of two labeled belonging's bags given to officer.

## 2024-08-01 NOTE — Progress Notes (Signed)
 Admission Note:  Staff received patient accompanied by [facility]. IVC paperwork was looked over. Patient denies SI,HI and AVH. He reports hearing voices in the past. Patient has a legal guardian and lives at a group home name [facility]. He does not recall what happened at the facility that landed him IVC. He currently pleasant and cooperative. Skin assessment completed by nurse and MHT. He has a old scar on lower wrist where he said He fell on a mirror around the age 89 or 47. Vitals stable. Patient denies medical history. Patient was then oriented to unit

## 2024-08-01 NOTE — Tx Team (Signed)
 Initial Treatment Plan 08/01/2024 6:46 PM Richard Abbott FMW:[id number]    PATIENT STRESSORS: Other: reports none     PATIENT STRENGTHS: Work skills    PATIENT IDENTIFIED PROBLEMS: None                     DISCHARGE CRITERIA:  Improved stabilization in mood, thinking, and/or behavior  PRELIMINARY DISCHARGE PLAN: Return to previous living arrangement  PATIENT/FAMILY INVOLVEMENT: This treatment plan has been presented to and reviewed with the patient, Richard Abbott, and/or family member, .  The patient and family have been given the opportunity to ask questions and make suggestions.  Elouise Wolm Morel, RN 08/01/2024, 6:46 PM

## 2024-08-01 NOTE — Group Note (Signed)
 [facility] LCSW Group Therapy Note   Group Date: 08/01/2024 Start Time: 1015 End Time: 1115   Type of Therapy/Topic:  Group Therapy:  Balance in Life.  Participation Level:  Active   Description of Group:    This group will address the concept of balance and how it feels and looks when one is unbalanced. Patients will be encouraged to process areas in their lives that are out of balance, and identify reasons for remaining unbalanced. Facilitators will guide patients utilizing problem- solving interventions to address and correct the stressor making their life unbalanced. Understanding and applying boundaries will be explored and addressed for obtaining  and maintaining a balanced life. Patients will be encouraged to explore ways to assertively make their unbalanced needs known to significant others in their lives, using other group members and facilitator for support and feedback.  Therapeutic Goals: Patient will identify two or more emotions or situations they have that consume much of in their lives. Patient will identify signs/triggers that life has become out of balance:  Patient will identify two ways to set boundaries in order to achieve balance in their lives:  Patient will demonstrate ability to communicate their needs through discussion and/or role plays  Summary of Patient Progress: Patient actively participated sharing balancing stressors, positive aspects of life and who they feel safe communicating their needs.   Therapeutic Modalities:   Cognitive Behavioral Therapy Solution-Focused Therapy Assertiveness Training   [place], LCSWA

## 2024-08-01 NOTE — Plan of Care (Signed)
   Problem: Education: Goal: Knowledge of [place] [facility] Education information/materials will improve Outcome: Progressing Goal: Emotional status will improve Outcome: Progressing Goal: Mental status will improve Outcome: Progressing

## 2024-08-01 NOTE — Group Note (Signed)
 Date:  08/01/2024 Time:  8:51 PM  Group Topic/Focus:  Wrap-Up Group:   The focus of this group is to help patients review their daily goal of treatment and discuss progress on daily workbooks.    Participation Level:  Active  Participation Quality:  Appropriate  Affect:  Appropriate  Cognitive:  Appropriate  Insight: Appropriate  Engagement in Group:  Engaged  Modes of Intervention:  Education and Exploration  Additional Comments:  Patient attended and participated in group tonight.  He reports that his goal for today was to concentrate. He wants to stay here and feels he is getting better.  Richard Abbott 08/01/2024, 8:51 PM

## 2024-08-02 LAB — LIPID PANEL
Cholesterol: 119 mg/dL (ref 0–200)
HDL: 34 mg/dL — ABNORMAL LOW (ref >40)
LDL Cholesterol: 65 mg/dL (ref 0–99)
Total CHOL/HDL Ratio: 3.5 ratio
Triglycerides: 102 mg/dL (ref <150)
VLDL: 20 mg/dL (ref 0–40)

## 2024-08-02 LAB — VITAMIN D 25 HYDROXY (VIT D DEFICIENCY, FRACTURES): Vit D, 25-Hydroxy: 58.79 ng/mL (ref 30–100)

## 2024-08-02 LAB — HEMOGLOBIN A1C
Hgb A1c MFr Bld: 5.7 % — ABNORMAL HIGH (ref 4.8–5.6)
Mean Plasma Glucose: 116.89 mg/dL

## 2024-08-02 MED ORDER — DIVALPROEX SODIUM 500 MG PO DR TAB
500.0000 mg | DELAYED_RELEASE_TABLET | Freq: Every day | ORAL | Status: DC
  Administered 2024-08-03 – 2024-08-06 (×4): 500 mg via ORAL
  Filled 2024-08-02: qty 1
  Filled 2024-08-02: qty 2
  Filled 2024-08-02 (×2): qty 1

## 2024-08-02 MED ORDER — HALOPERIDOL DECANOATE 100 MG/ML IM SOLN
100.0000 mg | Freq: Once | INTRAMUSCULAR | Status: AC
  Administered 2024-08-02: 100 mg via INTRAMUSCULAR
  Filled 2024-08-02: qty 1

## 2024-08-02 NOTE — [facility] Suicide Risk Assessment (Signed)
 [facility] INPATIENT:  Family/Significant Other Suicide Prevention Education  Suicide Prevention Education:  Contact Attempts: Sharene Rota, (legal guardian -[place] [facility] [phone]) has been identified by the patient as legal guardian whom the patient will be residing, and identified as the person(s) who will aid the patient in the event of a mental health crisis.  With written consent from the patient, one attempts were made to provide suicide prevention education, prior to and/or following the patient's discharge.  We were unsuccessful in providing suicide prevention education.  Date and time of first attempt:08/02/2024 /1532.  *CSW unable to reach either number; messages left to return call to [facility]/SWD.   Richard Abbott 08/02/2024, 3:51 PM

## 2024-08-02 NOTE — Plan of Care (Signed)
   Problem: Education: Goal: Emotional status will improve Outcome: Progressing Goal: Mental status will improve Outcome: Progressing

## 2024-08-02 NOTE — Progress Notes (Signed)
 [facility] MD Progress Note  08/02/2024 10:02 AM Richard Abbott  MRN:  [id number]  Principal Problem: Schizophrenia (HCC) Diagnosis: Principal Problem:   Schizophrenia (HCC)   Reason for Admission:  Richard Abbott is a 58 y.o., male with history of schizophrenia living in a group home is admitted to behavioral health hospital involuntarily due to worsening psychosis and making threats to hurt others. Patient has a legal guardian through [facility] DHS (Latoya Annson) (admitted on 08/01/2024, total  LOS: 1 day )   ASSESSMENT: Patient appears to be improving and has not displayed signs of severe agitation. Per group home supervisor, he is on haloperidol decanoate 200 mg every 3 weeks and last received this at group home on 9/2. This may have contributed to his breakthrough agitation and psychosis as he missed his dose that should have been 9/23. Received a dose of haloperidol decanoate 100 mg at [facility] on 10/2 so will give other half of LAI dose today.   PLAN: - Haloperidol decanoate 100 mg today (10/5)  -received haloperidol decanoate 200 mg 9/2  -received haloperidol decanoate 100 mg 10/2  -home dose: haloperidol decanoate 200 mg every 3 weeks (next dose due: ~10/26) - Continue Depakote  twice daily 500 mg in the morning and 1000 mg at night - Continue Cogentin  1 mg 3 times daily - Continue sertraline  100 mg daily - Continue Cogentin  1 mg 3 times daily - Continue MiraLAX as needed for moderate constipation - Continue trazodone 50 mg nightly for insomnia   Disposition Planning: -- Estimated LOS: 3 days --Estimated Discharge Date 08/04/24 --Barriers to Discharge: ongoing psychosis -- Discharge Goals: Return home with outpatient referrals for mental health follow-up including medication management/psychotherapy  Subjective: The patient was seen and evaluated on the unit. On assessment today the patient reports doing well.  Denies SI/HI/AVH.  Reports eating and sleeping well.  No EPS noted on  physical exam.  He reports overall doing well and understands he needs to get another dose of LAI today due to not having the complete dose administered to him 3 days ago.  He had no acute questions.  Collateral: Geni Mink (supervisor for group home) Patient normally on haloperidol decanoate 200 mg every 3 weeks.  He last received this dose of LAI on 06/30/2024.  While being verbally aggressive at times, patient has never displayed physical aggression until the day he was IVC and ran at group home supervisor.  He has been having worsening symptoms of psychosis apparently responding to internal stimuli and being intrusive.  Patient can be a bully at times but is never physically aggressive.  Patient is able to return to group home upon psychiatric stabilization.  Unclear why he did not receive his long-acting injection 07/21/2024.   Objective: Chart Review from last 24 hours:  The patient's chart was reviewed and nursing notes were reviewed. The patient's case was discussed in multidisciplinary team meeting.  - Overnight events to report per chart review / staff report: none - Patient took all prescribed medications yes - Patient received the following PRNs: none  Current Medications: Current Facility-Administered Medications  Medication Dose Route Frequency Provider Last Rate Last Admin   acetaminophen  (TYLENOL ) tablet 650 mg  650 mg Oral Q6H PRN Smith, Annie B, NP       alum & mag hydroxide-simeth (MAALOX/MYLANTA) 200-200-20 MG/5ML suspension 30 mL  30 mL Oral Q4H PRN Smith, Annie B, NP       atorvastatin  (LIPITOR) tablet 20 mg  20 mg Oral Daily Claudene Sham  B, NP   20 mg at 08/02/24 0846   benztropine  (COGENTIN ) tablet 1 mg  1 mg Oral TID Smith, Annie B, NP   1 mg at 08/02/24 0847   cholecalciferol  (VITAMIN D3) 25 MCG (1000 UNIT) tablet 1,000 Units  1,000 Units Oral Daily Smith, Annie B, NP   1,000 Units at 08/02/24 0847   haloperidol (HALDOL) tablet 5 mg  5 mg Oral TID PRN Smith, Annie B,  NP       And   diphenhydrAMINE (BENADRYL) capsule 50 mg  50 mg Oral TID PRN Smith, Annie B, NP       haloperidol lactate (HALDOL) injection 5 mg  5 mg Intramuscular TID PRN Smith, Annie B, NP       And   diphenhydrAMINE (BENADRYL) injection 50 mg  50 mg Intramuscular TID PRN Smith, Annie B, NP       And   LORazepam (ATIVAN) injection 2 mg  2 mg Intramuscular TID PRN Smith, Annie B, NP       haloperidol lactate (HALDOL) injection 10 mg  10 mg Intramuscular TID PRN Smith, Annie B, NP       And   diphenhydrAMINE (BENADRYL) injection 50 mg  50 mg Intramuscular TID PRN Smith, Annie B, NP       And   LORazepam (ATIVAN) injection 2 mg  2 mg Intramuscular TID PRN Smith, Annie B, NP       divalproex  (DEPAKOTE ) DR tablet 1,000 mg  1,000 mg Oral QHS Smith, Annie B, NP   1,000 mg at 08/01/24 2038   divalproex  (DEPAKOTE ) DR tablet 500 mg  500 mg Oral BH-q7a Smith, Annie B, NP   500 mg at 08/02/24 0613   haloperidol decanoate (HALDOL DECANOATE) 100 MG/ML injection 100 mg  100 mg Intramuscular Once Yamil Dougher, MD       magnesium hydroxide (MILK OF MAGNESIA) suspension 30 mL  30 mL Oral Daily PRN Smith, Annie B, NP       pantoprazole (PROTONIX) EC tablet 80 mg  80 mg Oral Daily Smith, Annie B, NP   80 mg at 08/02/24 9153   sertraline  (ZOLOFT ) tablet 100 mg  100 mg Oral BH-q7a Smith, Annie B, NP   100 mg at 08/02/24 0613   traZODone (DESYREL) tablet 50 mg  50 mg Oral QHS Smith, Annie B, NP   50 mg at 08/01/24 2038    Lab Results:  Results for orders placed or performed during the hospital encounter of 08/01/24 (from the past 48 hours)  Lipid panel     Status: Abnormal   Collection Time: 08/02/24  6:23 AM  Result Value Ref Range   Cholesterol 119 0 - 200 mg/dL    Comment:        ATP III CLASSIFICATION:  <200     mg/dL   Desirable  799-760  mg/dL   Borderline High  >=759    mg/dL   High           Triglycerides 102 <150 mg/dL   HDL 34 (L) >59 mg/dL   Total CHOL/HDL Ratio 3.5 RATIO   VLDL 20 0 - 40  mg/dL   LDL Cholesterol 65 0 - 99 mg/dL    Comment:        Total Cholesterol/HDL:CHD Risk Coronary Heart Disease Risk Table                     Men   Women  1/2 Average Risk  3.4   3.3  Average Risk       5.0   4.4  2 X Average Risk   9.6   7.1  3 X Average Risk  23.4   11.0        Use the calculated Patient Ratio above and the CHD Risk Table to determine the patient's CHD Risk.        ATP III CLASSIFICATION (LDL):  <100     mg/dL   Optimal  899-870  mg/dL   Near or Above                    Optimal  130-159  mg/dL   Borderline  839-810  mg/dL   High  >809     mg/dL   Very High Performed at [facility], [address] [place], [place] [address]   Hemoglobin A1c     Status: Abnormal   Collection Time: 08/02/24  6:23 AM  Result Value Ref Range   Hgb A1c MFr Bld 5.7 (H) 4.8 - 5.6 %    Comment: (NOTE) Diagnosis of Diabetes The following HbA1c ranges recommended by the American Diabetes Association (ADA) may be used as an aid in the diagnosis of diabetes mellitus.  Hemoglobin             Suggested A1C NGSP%              Diagnosis  <5.7                   Non Diabetic  5.7-6.4                Pre-Diabetic  >6.4                   Diabetic  <7.0                   Glycemic control for                       adults with diabetes.     Mean Plasma Glucose 116.89 mg/dL    Comment: Performed at [facility], [address], [place], [place] [address]  VITAMIN D  25 Hydroxy (Vit-D Deficiency, Fractures)     Status: None   Collection Time: 08/02/24  6:23 AM  Result Value Ref Range   Vit D, 25-Hydroxy 58.79 30 - 100 ng/mL    Comment: (NOTE) Vitamin D  deficiency has been defined by the Institute of Medicine  and an Endocrine Society practice guideline as a level of serum 25-OH  vitamin D  less than 20 ng/mL (1,2). The Endocrine Society went on to  further define vitamin D  insufficiency as a level between 21 and 29  ng/mL (2).  1. IOM (Institute of  Medicine). 2010. Dietary reference intakes for  calcium  and D. [place]: [employer]. 2. Holick MF, Binkley [place], Bischoff-Ferrari HA, et al. Evaluation,  treatment, and prevention of vitamin D  deficiency: an Endocrine  Society clinical practice guideline, JCEM. 2011 Jul; 96(7): 1911-30.  Performed at [facility], [address], [place], [place] [address]     Blood Alcohol level:  Lab Results  Component Value Date   [facility] <15 07/30/2024   ETH <10 09/26/2022    Metabolic Labs: Lab Results  Component Value Date   HGBA1C 5.7 (H) 08/02/2024   MPG 116.89 08/02/2024   MPG 137 06/26/2018   No results found  for: PROLACTIN Lab Results  Component Value Date   CHOL 119 08/02/2024   TRIG 102 08/02/2024   HDL 34 (L) 08/02/2024   CHOLHDL 3.5 08/02/2024   VLDL 20 08/02/2024   LDLCALC 65 08/02/2024   LDLCALC 80 12/05/2017    Physical Findings: AIMS: 0   Psychiatric Specialty Exam: General Appearance: Appropriate for Environment; Casual   Eye Contact: Fair   Speech: Clear and Coherent; Normal Rate   Volume: Normal   Mood: Euthymic   Affect: Appropriate; Congruent (smiles inappropriately at times)   Thought Content: Logical   Suicidal Thoughts: Suicidal Thoughts: No   Homicidal Thoughts: Homicidal Thoughts: No   Thought Process: Coherent; Goal Directed   Orientation: Full (Time, Place and Person)     Memory: Remote Fair   Judgment: Poor   Insight: Poor   Concentration: Fair   Recall: [employer] of Knowledge: Fair   Language: Fair   Psychomotor Activity: Psychomotor Activity: Normal   Assets: Manufacturing systems engineer; Physical Health; Resilience   Sleep: Sleep: Good    Review of Systems ROS  Vital Signs: Blood pressure 124/79, pulse 70, temperature (!) 97.3 F (36.3 C), temperature source Oral, resp. rate 20, height 5' 7 (1.702 m), weight 78.8 kg, SpO2 99%. Body mass index is 27.22 kg/m. Physical Exam  I certify that  inpatient services furnished can reasonably be expected to improve the patient's condition.   Signed: Prentice Espy, MD 08/02/2024, 10:02 AM

## 2024-08-02 NOTE — BH Assessment (Signed)
(  Sleep Hours) - 6.25 (Any PRNs that were needed, meds refused, or side effects to meds)-  (Any disturbances and when (visitation, over night)- None (Concerns raised by the patient)- None (SI/HI/AVH)- Denies

## 2024-08-02 NOTE — Group Note (Signed)
 Date:  08/02/2024 Time:  8:55 PM  Group Topic/Focus:  Wrap-Up Group:   The focus of this group is to help patients review their daily goal of treatment and discuss progress on daily workbooks.    Participation Level:  Active  Participation Quality:  Appropriate and Attentive  Affect:  Appropriate  Cognitive:  Alert and Appropriate  Insight: Appropriate and Good  Engagement in Group:  Engaged  Modes of Intervention:  Discussion and Education  Additional Comments:  Pt attended and participated in wrap up group this evening and rated their day a 10/10. Pt stated that they rested well, have been compliant with their medications and well as have eaten well today, which has also been the pt's goal for the day. Pt has no concerns to relay at this time.   Richard Abbott 08/02/2024, 8:55 PM

## 2024-08-02 NOTE — Plan of Care (Signed)
   Problem: Education: Goal: Knowledge of [place] [facility] Education information/materials will improve Outcome: Progressing Goal: Emotional status will improve Outcome: Progressing Goal: Mental status will improve Outcome: Progressing Goal: Verbalization of understanding the information provided will improve Outcome: Progressing
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
## 2024-08-02 NOTE — Progress Notes (Signed)
   08/02/24 1000  Psych Admission Type (Psych Patients Only)  Admission Status Involuntary  Psychosocial Assessment  Patient Complaints None  Eye Contact Fair  Facial Expression Animated  Affect Appropriate to circumstance  Speech Logical/coherent  Interaction Assertive  Motor Activity Slow  Appearance/Hygiene Unremarkable  Behavior Characteristics Cooperative  Mood Euthymic  Thought Process  Coherency WDL  Content WDL  Delusions None reported or observed  Perception WDL  Hallucination None reported or observed  Judgment Poor  Confusion None  Danger to Self  Current suicidal ideation? Denies  Danger to Others  Danger to Others None reported or observed   DAR NOTE: Patient presents with a calm affect and mood.  Denies suicidal thoughts, auditory and visual hallucinations.  Rates depression at 0, hopelessness at 0, and anxiety at 0.  Maintained on routine safety checks.  Medications given as prescribed.  Support and encouragement offered as needed.  Haldol 100 mg long acting injectable given.  No adverse reaction noted.  Patient observed socializing with peers in the milieu.  Offered no complaint.

## 2024-08-03 ENCOUNTER — Encounter (HOSPITAL_COMMUNITY): Payer: Self-pay

## 2024-08-03 DIAGNOSIS — F209 Schizophrenia, unspecified: Principal | ICD-10-CM

## 2024-08-03 MED ORDER — POLYETHYLENE GLYCOL 3350 17 G PO PACK: 17.0000 g | PACK | Freq: Every day | ORAL | Status: DC | PRN

## 2024-08-03 MED ORDER — HALOPERIDOL DECANOATE 100 MG/ML IM SOLN: 200.0000 mg | INTRAMUSCULAR | Status: DC

## 2024-08-03 NOTE — BH IP Treatment Plan (Signed)
 Interdisciplinary Treatment and Diagnostic Plan Update  08/03/2024 Time of Session: 11:05AM Richard Abbott MRN: [id number]  Principal Diagnosis: Schizophrenia [facility])  Secondary Diagnoses: Principal Problem:   Schizophrenia (HCC)   Current Medications:  Current Facility-Administered Medications  Medication Dose Route Frequency Provider Last Rate Last Admin   acetaminophen  (TYLENOL ) tablet 650 mg  650 mg Oral Q6H PRN Smith, Annie B, NP       alum & mag hydroxide-simeth (MAALOX/MYLANTA) 200-200-20 MG/5ML suspension 30 mL  30 mL Oral Q4H PRN Smith, Annie B, NP       atorvastatin  (LIPITOR) tablet 20 mg  20 mg Oral Daily Smith, Annie B, NP   20 mg at 08/03/24 0815   benztropine  (COGENTIN ) tablet 1 mg  1 mg Oral TID Smith, Annie B, NP   1 mg at 08/03/24 1633   cholecalciferol  (VITAMIN D3) 25 MCG (1000 UNIT) tablet 1,000 Units  1,000 Units Oral Daily Smith, Annie B, NP   1,000 Units at 08/03/24 0815   haloperidol (HALDOL) tablet 5 mg  5 mg Oral TID PRN Smith, Annie B, NP       And   diphenhydrAMINE (BENADRYL) capsule 50 mg  50 mg Oral TID PRN Smith, Annie B, NP       haloperidol lactate (HALDOL) injection 5 mg  5 mg Intramuscular TID PRN Smith, Annie B, NP       And   diphenhydrAMINE (BENADRYL) injection 50 mg  50 mg Intramuscular TID PRN Smith, Annie B, NP       And   LORazepam (ATIVAN) injection 2 mg  2 mg Intramuscular TID PRN Smith, Annie B, NP       haloperidol lactate (HALDOL) injection 10 mg  10 mg Intramuscular TID PRN Smith, Annie B, NP       And   diphenhydrAMINE (BENADRYL) injection 50 mg  50 mg Intramuscular TID PRN Smith, Annie B, NP       And   LORazepam (ATIVAN) injection 2 mg  2 mg Intramuscular TID PRN Smith, Annie B, NP       divalproex  (DEPAKOTE ) DR tablet 1,000 mg  1,000 mg Oral QHS Smith, Annie B, NP   1,000 mg at 08/02/24 2058   divalproex  (DEPAKOTE ) DR tablet 500 mg  500 mg Oral Daily Ji, Andrew, MD   500 mg at 08/03/24 0815   [START ON 08/23/2024] haloperidol decanoate  (HALDOL DECANOATE) 100 MG/ML injection 200 mg  200 mg Intramuscular Q21 days McCarty, Artie, MD       pantoprazole (PROTONIX) EC tablet 80 mg  80 mg Oral Daily Smith, Annie B, NP   80 mg at 08/03/24 0815   polyethylene glycol (MIRALAX / GLYCOLAX) packet 17 g  17 g Oral Daily PRN McCarty, Artie, MD       sertraline  (ZOLOFT ) tablet 100 mg  100 mg Oral Landrum Sharps, Annie B, NP   100 mg at 08/03/24 9383   traZODone (DESYREL) tablet 50 mg  50 mg Oral QHS Smith, Annie B, NP   50 mg at 08/02/24 2058   PTA Medications: Medications Prior to Admission  Medication Sig Dispense Refill Last Dose/Taking   atorvastatin  (LIPITOR) 20 MG tablet Take 1 tablet (20 mg total) by mouth daily. 90 tablet 0    benztropine  (COGENTIN ) 1 MG tablet Take 1 mg by mouth 3 (three) times daily.      cholecalciferol  (VITAMIN D3) 25 MCG (1000 UNIT) tablet Take 1,000 Units by mouth daily.      divalproex  (DEPAKOTE ) 500  MG DR tablet Take 500 mg by mouth every morning.      divalproex  (DEPAKOTE ) 500 MG DR tablet Take 1,000 mg by mouth at bedtime.       haloperidol decanoate (HALDOL DECANOATE) 100 MG/ML injection Inject 100 mg into the muscle every 28 (twenty-eight) days.      omeprazole  (PRILOSEC) 20 MG capsule TAKE 1 CAPSULE BY MOUTH EACH MORNING AT 6:30AM (Patient taking differently: Take 20 mg by mouth daily.) 30 capsule 11    sertraline  (ZOLOFT ) 100 MG tablet Take 100 mg by mouth every morning.      traZODone (DESYREL) 50 MG tablet Take 50 mg by mouth at bedtime.       Patient Stressors: Other: reports none    Patient Strengths: Work Customer service manager Modalities: Medication Management, Group therapy, Case management,  1 to 1 session with clinician, Psychoeducation, Recreational therapy.   Physician Treatment Plan for Primary Diagnosis: Schizophrenia (HCC) Long Term Goal(s):     Short Term Goals:    Medication Management: Evaluate patient's response, side effects, and tolerance of medication regimen.  Therapeutic  Interventions: 1 to 1 sessions, Unit Group sessions and Medication administration.  Evaluation of Outcomes: Not Progressing  Physician Treatment Plan for Secondary Diagnosis: Principal Problem:   Schizophrenia (HCC)  Long Term Goal(s):     Short Term Goals:       Medication Management: Evaluate patient's response, side effects, and tolerance of medication regimen.  Therapeutic Interventions: 1 to 1 sessions, Unit Group sessions and Medication administration.  Evaluation of Outcomes: Not Progressing   RN Treatment Plan for Primary Diagnosis: Schizophrenia (HCC) Long Term Goal(s): Knowledge of disease and therapeutic regimen to maintain health will improve  Short Term Goals: Ability to remain free from injury will improve, Ability to verbalize frustration and anger appropriately will improve, Ability to demonstrate self-control, Ability to participate in decision making will improve, Ability to verbalize feelings will improve, and Compliance with prescribed medications will improve  Medication Management: RN will administer medications as ordered by provider, will assess and evaluate patient's response and provide education to patient for prescribed medication. RN will report any adverse and/or side effects to prescribing provider.  Therapeutic Interventions: 1 on 1 counseling sessions, Psychoeducation, Medication administration, Evaluate responses to treatment, Monitor vital signs and CBGs as ordered, Perform/monitor CIWA, COWS, AIMS and Fall Risk screenings as ordered, Perform wound care treatments as ordered.  Evaluation of Outcomes: Not Progressing   LCSW Treatment Plan for Primary Diagnosis: Schizophrenia (HCC) Long Term Goal(s): Safe transition to appropriate next level of care at discharge, Engage patient in therapeutic group addressing interpersonal concerns.  Short Term Goals: Engage patient in aftercare planning with referrals and resources, Increase social support, Increase  ability to appropriately verbalize feelings, Increase emotional regulation, Facilitate acceptance of mental health diagnosis and concerns, and Increase skills for wellness and recovery  Therapeutic Interventions: Assess for all discharge needs, 1 to 1 time with Social worker, Explore available resources and support systems, Assess for adequacy in community support network, Educate family and significant other(s) on suicide prevention, Complete Psychosocial Assessment, Interpersonal group therapy.  Evaluation of Outcomes: Not Progressing   Progress in Treatment: Attending groups: Yes. Attending some groups Participating in groups: Yes. Some groups Taking medication as prescribed: Yes. Toleration medication: Yes. Family/Significant other contact made: No, will contact:  Sharene Rota (Guardian) [phone] Patient understands diagnosis: Yes. Discussing patient identified problems/goals with staff: Yes. Medical problems stabilized or resolved: Yes. Denies suicidal/homicidal ideation: Yes. Issues/concerns per patient self-inventory:  No.   Patient Goals:  managing my medications and going to group  Discharge Plan or Barriers: Patient will likely discharge back to group home once stable.   Reason for Continuation of Hospitalization: Aggression Medical Issues  Estimated Length of Stay: 5-7 days  Last 3 [place] Suicide Severity Risk Score: Flowsheet Row [facility] (Current[facility]30/2024 [facility] from 04/23/2023 in [facility] at [facility]  C-SSRS RISK CATEGORY No Risk No Risk No Risk    Last PHQ 2/9 Scores:    05/06/2018    9:07 AM 01/09/2018    9:06 AM 11/14/2017   10:23 AM  Depression screen PHQ 2/9  Decreased Interest 0 0 0  Down, Depressed, Hopeless 0 0 0  PHQ - 2 Score 0 0 0    Scribe for Treatment Team: Amaranta Mehl M Kinya Meine,  LCSWA 08/03/2024 4:39 PM

## 2024-08-03 NOTE — Plan of Care (Signed)
   Problem: Education: Goal: Knowledge of [place] [facility] Education information/materials will improve Outcome: Progressing Goal: Emotional status will improve Outcome: Progressing Goal: Mental status will improve Outcome: Progressing Goal: Verbalization of understanding the information provided will improve Outcome: Progressing

## 2024-08-03 NOTE — Group Note (Signed)
 Recreation Therapy Group Note   Group Topic:Coping Skills  Group Date: 08/03/2024 Start Time: 1010 End Time: 1040 Facilitators: Cerita Rabelo-McCall, LRT,CTRS Location: 500 [address] Dayroom   Group Topic: Coping Skills   Goal Area(s) Addresses: Patient will define what a coping skill is. Patient will work with peer to create a list of healthy coping skills beginning with each letter of the alphabet. Patient will successfully identify positive coping skills they can use post d/c.  Patient will acknowledge benefit(s) of using learned coping skills post d/c.  Behavioral Response:    Intervention: Worksheet   Activity: Coping A to Z. Patient asked to identify what a coping skill is and when they use them. Patients with Clinical research associate discussed healthy versus unhealthy coping skills. Next patients were given a blank worksheet titled Coping Skills A-Z.  Partners were instructed to come up with at least one positive coping skill per letter of the alphabet. Patients were given 15 minutes to brainstorm before ideas were presented to the large group. Patients and LRT debriefed on the importance of coping skill selection based on situation and back-up plans when a skill tried is not effective. At the end of group, patients were given an handout of alphabetized strategies to keep for future reference.   Education: Pharmacologist, Scientist, physiological, Discharge Planning.    Education Outcome: Acknowledges education/Verbalizes understanding/In group clarification offered/Additional education needed   Affect/Mood: N/A   Participation Level: Did not attend    Clinical Observations/Individualized Feedback:      Plan: Continue to engage patient in RT group sessions 2-3x/week.   Apolo Cutshaw-McCall, LRT,CTRS 08/03/2024 12:55 PM

## 2024-08-03 NOTE — [facility] Suicide Risk Assessment (Addendum)
 [facility] INPATIENT:  Family/Significant Other Suicide Prevention Education  Suicide Prevention Education:  Contact Attempts: Sharene Rota (Guardian) [phone], (name of family member/significant other) has been identified by the patient as the family member/significant other with whom the patient will be residing, and identified as the person(s) who will aid the patient in the event of a mental health crisis.  With written consent from the patient, two attempts were made to provide suicide prevention education, prior to and/or following the patient's discharge.  We were unsuccessful in providing suicide prevention education.  A suicide education pamphlet was given to the patient to share with family/significant other.  Date and time of first attempt: 08/03/2024 / 5:19 PM  CSW left a voicemail.  Date and time of second attempt:  second attempt needed   Halim Surrette O Maraya Gwilliam, LCSWA 08/03/2024, 5:18 PM

## 2024-08-03 NOTE — Group Note (Signed)
 LCSW Group Therapy Note   Group Date: 08/03/2024 Start Time: 1300 End Time: 1400   Participation:  patient was present and actively participated in the discussion  Type of Therapy:  Group Therapy  Objective:  Learn techniques for managing stress through body relaxation, mindfulness, and self-compassion.  Goals: Use body relaxation techniques, such as Box Breathing and Progressive Muscle Relaxation, to reduce physical tension. Practice mindfulness to break the cycle of overthinking and mental chatter. Embrace self-compassion to handle stress with kindness and resilience.  Summary:  Today's session focused on calming the body with relaxation techniques, breaking the cycle of stress with mindfulness, and using self-compassion to manage challenges more gracefully. These tools help reduce stress and foster a balanced, peaceful mindset.  Therapeutic Modalities used:  Elements of CBT ( cognitive restructuring)  Elements of DBT (box breathing, progressive body relaxation, mindfulness, acceptance)    Allyson Tineo O Tripton Ned, LCSWA 08/03/2024  6:37 PM

## 2024-08-03 NOTE — Group Note (Signed)
 Date:  08/03/2024 Time:  8:47 PM  Group Topic/Focus:  Wrap-Up Group:   The focus of this group is to help patients review their daily goal of treatment and discuss progress on daily workbooks.    Participation Level:  Active  Participation Quality:  Appropriate  Affect:  Appropriate  Cognitive:  Appropriate  Insight: Appropriate  Engagement in Group:  Engaged  Modes of Intervention:  Education and Exploration  Additional Comments:  Patient attended and participated in group tonight. He reports that today in group he learn how to breath, pressure points and stretching.  Gwenn Chillington Dacosta 08/03/2024, 8:47 PM

## 2024-08-03 NOTE — Plan of Care (Signed)
  Problem: Education: Goal: Emotional status will improve 08/03/2024 1001 by Shireen Mercie DEL, RN Outcome: Progressing 08/03/2024 0959 by Shireen Mercie DEL, RN Outcome: Progressing Goal: Mental status will improve 08/03/2024 1001 by Shireen Mercie DEL, RN Outcome: Progressing 08/03/2024 0959 by Shireen Mercie DEL, RN Outcome: Progressing Goal: Verbalization of understanding the information provided will improve Outcome: Progressing
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
## 2024-08-03 NOTE — Progress Notes (Signed)
 Patient denies SI, AH, VH. Patient stated they slept good last night. Scored zero on anxiety, feeling of hopelessness, and zero on depression. Patient goal is trying to go home... and plans to meet his goal by participate in every group Patient has been calm, cooperative, and med compliant.    Mercie VEAR Banana, RN 10:07 AM   Problem: Education: Goal: Emotional status will improve 08/03/2024 1001 by Banana Mercie VEAR, RN Outcome: Progressing 08/03/2024 0959 by Banana Mercie VEAR, RN Outcome: Progressing Goal: Mental status will improve 08/03/2024 1001 by Banana Mercie VEAR, RN Outcome: Progressing 08/03/2024 0959 by Banana Mercie VEAR, RN Outcome: Progressing Goal: Verbalization of understanding the information provided will improve Outcome: Progressing

## 2024-08-03 NOTE — BH Assessment (Signed)
(  Sleep Hours) - 9.5 (Any PRNs that were needed, meds refused, or side effects to meds)-  (Any disturbances and when (visitation, over night)- None (Concerns raised by the patient)- None (SI/HI/AVH)- Denies

## 2024-08-03 NOTE — Progress Notes (Signed)
 Collateral contact - [facility], [address] [place], [place] [address], [phone]  CSW was advised that patient is in the group home in [place], [phone].  CSW called [phone] but there was no response.  Voicemail was full so CSW was unable to leave a voicemail.   Taylen Osorto, LCSWA 08/03/2024
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
## 2024-08-04 ENCOUNTER — Encounter (HOSPITAL_COMMUNITY): Payer: Self-pay

## 2024-08-04 NOTE — Progress Notes (Signed)
(  Sleep Hours) -10.5  (Any PRNs that were needed, meds refused, or side effects to meds)-   (Any disturbances and when (visitation, over night)-none  (Concerns raised by the patient)- denies , Ask me 3 discussed with pt and verbal understanding stated.    (SI/HI/AVH)-AH

## 2024-08-04 NOTE — Group Note (Signed)
 Date:  08/04/2024 Time:  8:49 PM  Group Topic/Focus:  Wrap-Up Group:   The focus of this group is to help patients review their daily goal of treatment and discuss progress on daily workbooks.    Participation Level:  Did Not Attend  Participation Quality:  Did Not Attend  Affect:  Did Not Attend   Cognitive:  Did Not Attend  Insight: None  Engagement in Group:  Did Not Attend   Modes of Intervention:  Did Not Attend  Additional Comments:  Pt was encouraged to attend wrap up group but did not attend.  Lonni Na 08/04/2024, 8:49 PM
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
## 2024-08-04 NOTE — Group Note (Signed)
 Recreation Therapy Group Note   Group Topic:Animal Assisted Therapy   Group Date: 08/04/2024 Start Time: 9050 End Time: 1030 Facilitators: Hira Trent-McCall, LRT,CTRS Location: [address] Dayroom   Animal-Assisted Activity (AAA) Program Checklist/Progress Notes Patient Eligibility Criteria Checklist & Daily Group note for Rec Tx Intervention  AAA/T Program Assumption of Risk Form signed by Patient/ or Parent Legal Guardian Yes  Patient is free of allergies or severe asthma Yes  Patient reports no fear of animals Yes  Patient reports no history of cruelty to animals Yes  Patient understands his/her participation is voluntary Yes  Patient washes hands before animal contact Yes  Patient washes hands after animal contact Yes  Behavioral Response: Engaged   Education: Charity fundraiser, Appropriate Animal Interaction   Education Outcome: Acknowledges education.    Affect/Mood: Appropriate   Participation Level: Engaged   Participation Quality: Independent   Behavior: Appropriate   Speech/Thought Process: Focused   Insight: Good   Judgement: Good   Modes of Intervention: Teaching laboratory technician   Patient Response to Interventions:  Engaged   Education Outcome:  In group clarification offered    Clinical Observations/Individualized Feedback: Patient attended session and interacted appropriately with therapy dog and peers. Patient asked appropriate questions about therapy dog and his training. Patient shared stories about their pets at home with group.      Plan: Continue to engage patient in RT group sessions 2-3x/week.   Janyla Biscoe-McCall, LRT,CTRS 08/04/2024 1:20 PM

## 2024-08-04 NOTE — Plan of Care (Signed)
   Problem: Education: Goal: Knowledge of [place] [facility] Education information/materials will improve Outcome: Progressing Goal: Emotional status will improve Outcome: Progressing Goal: Mental status will improve Outcome: Progressing Goal: Verbalization of understanding the information provided will improve Outcome: Progressing
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
## 2024-08-04 NOTE — Group Note (Signed)
 Recreation Therapy Group Note   Group Topic:Leisure Education  Group Date: 08/04/2024 Start Time: 1040 End Time: 1105 Facilitators: Mylei Brackeen-McCall, LRT,CTRS Location: 500 [address] Dayroom   Group Topic: Leisure Education   Goal Area(s) Addresses:  Patient will successfully identify positive leisure and recreation activities.  Patient will acknowledge benefits of participation in healthy leisure activities post discharge.  Patient will actively work with peers toward a shared goal.   Behavioral Response:    Intervention: Cooperative Group Game    Activity: Keep It Contractor. LRT and patients discussed what leisure was and what prevents people from taking time for leisure. The group then engaged in a game of volleyball. The object of the game is to keep the ball in motion at all times. The ball can be bounced off the floor but it can't come to a complete stop. LRT will be timing the group as they play the game. If the ball were to come to a stop, the time would start over.   Education:  Teacher, English as a foreign language, Leisure as Merchant navy officer, Programmer, applications, Building control surveyor   Education Outcome: Acknowledges education/In group clarification offered/Needs additional education   Affect/Mood: N/A   Participation Level: Did not attend    Clinical Observations/Individualized Feedback:      Plan: Continue to engage patient in RT group sessions 2-3x/week.   Richard Abbott, LRT,CTRS 08/04/2024 1:43 PM

## 2024-08-04 NOTE — Plan of Care (Signed)
  Problem: Activity: Goal: Interest or engagement in activities will improve Outcome: Progressing   Problem: Health Behavior/Discharge Planning: Goal: Compliance with treatment plan for underlying cause of condition will improve Outcome: Progressing   

## 2024-08-04 NOTE — Plan of Care (Signed)
   Problem: Education: Goal: Emotional status will improve Outcome: Progressing Goal: Mental status will improve Outcome: Progressing   Problem: Coping: Goal: Ability to demonstrate self-control will improve Outcome: Progressing   Problem: Safety: Goal: Periods of time without injury will increase Outcome: Progressing

## 2024-08-04 NOTE — BH Assessment (Signed)
(  Sleep Hours) - 7.25 (Any PRNs that were needed, meds refused, or side effects to meds)-  (Any disturbances and when (visitation, over night)- None (Concerns raised by the patient)-  (SI/HI/AVH)- Denies

## 2024-08-05 NOTE — Progress Notes (Signed)
(  Sleep Hours) - 7.5 (Any PRNs that were needed, meds refused, or side effects to meds)- none (Any disturbances and when (visitation, over night)-none (Concerns raised by the patient)-none   (SI/HI/AVH)- denies

## 2024-08-05 NOTE — Plan of Care (Signed)
   Problem: Education: Goal: Emotional status will improve Outcome: Progressing Goal: Mental status will improve Outcome: Progressing   Problem: Activity: Goal: Interest or engagement in activities will improve Outcome: Progressing Goal: Sleeping patterns will improve Outcome: Progressing   Problem: Safety: Goal: Periods of time without injury will increase Outcome: Progressing

## 2024-08-05 NOTE — Plan of Care (Signed)
   Problem: Activity: Goal: Interest or engagement in activities will improve Outcome: Progressing   Problem: Coping: Goal: Ability to demonstrate self-control will improve Outcome: Progressing   Problem: Safety: Goal: Periods of time without injury will increase Outcome: Progressing

## 2024-08-05 NOTE — Group Note (Signed)
 Recreation Therapy Group Note   Group Topic:Emotion Expression  Group Date: 08/05/2024 Start Time: 1035 End Time: 1115 Facilitators: Joanny Dupree-McCall, LRT,CTRS Location: 500 [address] Dayroom   Group Topic/Focus: Self Expression    Goal Area(s) Addresses:  Patient will be able to identify a variety of ways to express themselves.  Patient will successfully share why it is beneficial to express emotions.   Behavioral Response: Engaged   Intervention: Music   Activity: Music Therapy. Patients were given the opportunity to chose songs that they used to help express different feelings and emotions. As long as the songs were clean and appropriate, they were played during group.   Education: Communication, Discharge Planning   Educational Outcome: Acknowledges education   Affect/Mood: Appropriate   Participation Level: Engaged   Participation Quality: Independent   Behavior: Appropriate   Speech/Thought Process: Relevant   Insight: Limited   Judgement: Limited   Modes of Intervention: Music   Patient Response to Interventions:  Engaged   Education Outcome:  In group clarification offered    Clinical Observations/Individualized Feedback: Pt was bright and engaged during activity. Pt was also singing along and dancing to some of the songs. Pt also talked with peers and had a good engagement with peers.    Plan: Continue to engage patient in RT group sessions 2-3x/week.   Lety Cullens-McCall, LRT,CTRS  08/05/2024 3:24 PM

## 2024-08-05 NOTE — Discharge Instructions (Signed)
 NEXT HALDOL INJECTION DOSE IS ON 10/26    -Follow-up with your outpatient psychiatric provider (and therapist) -instructions on appointment date, time, and address (location) are provided to you in discharge paperwork.  -Take your psychiatric medications as prescribed at discharge - instructions are provided to you in the discharge paperwork  -Follow-up with outpatient primary care doctor and other specialists -for management of preventative medicine and any chronic medical disease.  -Recommend abstinence from alcohol, tobacco, cannabis, and other substances at discharge.   -If your psychiatric symptoms recur, worsen, or if you have severe side effects to your psychiatric medications, call your outpatient psychiatric provider, 911, 988 (national suicide hotline), go to [facility], or go to the nearest emergency department.  -If suicidal thoughts occur, call your outpatient psychiatric provider, 911, 988 (national suicide hotline), go to [facility], or go to the nearest emergency department.  Naloxone (Narcan) can help reverse an overdose when given to the victim quickly.  [facility] offers free naloxone kits and instructions/training on its use.  Add naloxone to your first aid kit and you can help save a life.   Pick up your free kit at the following locations:   [place]:  [facility], [address] [place] [address] [phone]) [facility] [address] [place] [address] [phone]) [facility] [address] [place] [address]  [place]: [facility] [address] [place] [address] ([phone]) [facility] [address] [place] [address] [phone])

## 2024-08-05 NOTE — Progress Notes (Signed)
 [facility] MD Progress Note  08/05/2024 4:43 PM Richard Abbott  MRN:  [id number]  Principal Problem: Schizophrenia [facility]) Diagnosis: Principal Problem:   Schizophrenia (HCC)   Reason for Admission:  Richard Abbott is a 58 y.o., male with history of schizophrenia living in a group home is admitted to behavioral health hospital involuntarily due to worsening psychosis and making threats to hurt others. Patient has a legal guardian through [facility] Abbott [facility] (Richard Abbott) (admitted on 08/01/2024, total  LOS: 4 days )   ASSESSMENT: Psychiatrically stable for discharge. Jolanta LCSW spoke with Richard Abbott group home owner who can come on Thursday to pick patient up.  Legal guardian has been notified.   PLAN: - Continue Haloperidol decanoate 200 mg once every 21 days (next dose due: ~10/26)  -received haloperidol decanoate 200 mg 9/2  -received haloperidol decanoate 100 mg 10/2  -received haloperidol decanoate 100 mg 10/5 - Continue Depakote  twice daily 500 mg in the morning and 1000 mg at night - Continue Cogentin  1 mg 3 times daily - Continue sertraline  100 mg daily - Continue Cogentin  1 mg 3 times daily - Continue MiraLAX as needed for moderate constipation - Continue trazodone 50 mg nightly for insomnia   Disposition Planning: -- Estimated LOS: Tomorrow --Estimated Discharge Date 10/9 0900-1000 group home owner coming --Barriers to Discharge: Psychiatrically stable pending group home picking up -- Discharge Goals: Continue medications including LAI's  Subjective: Continuing to do well.  Discussed with patient that he will be tomorrow when he was very excited.  Continues to deny any side effects to medications. Pt denies extrapyramidal symptoms including dystonia (sudden spastic contractions of muscle groups), parkinsonism (bradykinesia, tremors, rigidity), and akathisia (severe restlessness).  Continues to report that he slept well.  Reports that he is eating all his meals.  Reports  that his mood is stable.  Denies AVH and paranoia.  Reports that he feels more calm than when he came in and does not feel any impulses to harm anybody else.  Says that he is full of LOC today.     Objective: Chart Review from last 24 hours:  Vitals stable.  Per overnight RN, slept 10.5 hours. Reports AH.   Per LCSW, spoke with group home and Richard gropu home owner will pick up patient on thurs morning from 0930-1000.  Per daytime RN, no concerns. Attended animal group.   Current Medications: Current Facility-Administered Medications  Medication Dose Route Frequency Provider Last Rate Last Admin   acetaminophen  (TYLENOL ) tablet 650 mg  650 mg Oral Q6H PRN Smith, Annie B, NP       alum & mag hydroxide-simeth (MAALOX/MYLANTA) 200-200-20 MG/5ML suspension 30 mL  30 mL Oral Q4H PRN Smith, Annie B, NP       atorvastatin  (LIPITOR) tablet 20 mg  20 mg Oral Daily Smith, Annie B, NP   20 mg at 08/05/24 9147   benztropine  (COGENTIN ) tablet 1 mg  1 mg Oral TID Smith, Annie B, NP   1 mg at 08/05/24 1238   cholecalciferol  (VITAMIN D3) 25 MCG (1000 UNIT) tablet 1,000 Units  1,000 Units Oral Daily Smith, Annie B, NP   1,000 Units at 08/05/24 9147   haloperidol (HALDOL) tablet 5 mg  5 mg Oral TID PRN Smith, Annie B, NP       And   diphenhydrAMINE (BENADRYL) capsule 50 mg  50 mg Oral TID PRN Smith, Annie B, NP       haloperidol lactate (HALDOL) injection 5 mg  5 mg Intramuscular  TID PRN Smith, Annie B, NP       And   diphenhydrAMINE (BENADRYL) injection 50 mg  50 mg Intramuscular TID PRN Smith, Annie B, NP       And   LORazepam (ATIVAN) injection 2 mg  2 mg Intramuscular TID PRN Smith, Annie B, NP       haloperidol lactate (HALDOL) injection 10 mg  10 mg Intramuscular TID PRN Smith, Annie B, NP       And   diphenhydrAMINE (BENADRYL) injection 50 mg  50 mg Intramuscular TID PRN Smith, Annie B, NP       And   LORazepam (ATIVAN) injection 2 mg  2 mg Intramuscular TID PRN Smith, Annie B, NP        divalproex  (DEPAKOTE ) DR tablet 1,000 mg  1,000 mg Oral QHS Smith, Annie B, NP   1,000 mg at 08/04/24 2030   divalproex  (DEPAKOTE ) DR tablet 500 mg  500 mg Oral Daily Ji, Andrew, MD   500 mg at 08/05/24 0852   [START ON 08/23/2024] haloperidol decanoate (HALDOL DECANOATE) 100 MG/ML injection 200 mg  200 mg Intramuscular Q21 days Sabirin Baray, MD       pantoprazole (PROTONIX) EC tablet 80 mg  80 mg Oral Daily Smith, Annie B, NP   80 mg at 08/05/24 0851   polyethylene glycol (MIRALAX / GLYCOLAX) packet 17 g  17 g Oral Daily PRN Ac Colan, MD       sertraline  (ZOLOFT ) tablet 100 mg  100 mg Oral Landrum Sharps, Annie B, NP   100 mg at 08/05/24 0605   traZODone (DESYREL) tablet 50 mg  50 mg Oral QHS Smith, Annie B, NP   50 mg at 08/04/24 2031    Lab Results:  No results found for this or any previous visit (from the past 48 hours).   Blood Alcohol level:  Lab Results  Component Value Date   [facility] <15 07/30/2024   ETH <10 09/26/2022    Metabolic Labs: Lab Results  Component Value Date   HGBA1C 5.7 (H) 08/02/2024   MPG 116.89 08/02/2024   MPG 137 06/26/2018   No results found for: PROLACTIN Lab Results  Component Value Date   CHOL 119 08/02/2024   TRIG 102 08/02/2024   HDL 34 (L) 08/02/2024   CHOLHDL 3.5 08/02/2024   VLDL 20 08/02/2024   LDLCALC 65 08/02/2024   LDLCALC 80 12/05/2017    Physical Findings: AIMS: 0   Psychiatric Specialty Exam: General Appearance: Appropriate for Environment; Casual   Eye Contact: Fair   Speech: Clear and Coherent; Normal Rate   Volume: Normal   Mood: Euthymic   Affect: Appropriate; Congruent (smiles inappropriately at times)   Thought Content: Logical No hallucinations or paranoia  Suicidal Thoughts: denies   Homicidal Thoughts: denies   Thought Process: Coherent; Goal Directed   Orientation: Full (Time, Place and Person)     Memory: Remote Fair   Judgment: Poor   Insight: Poor   Concentration: Fair   Recall:  [employer] of Knowledge: Fair   Language: Fair   Psychomotor Activity: normal, no eps   Assets: Manufacturing systems engineer; Physical Health; Resilience   Sleep: good    Review of Systems Review of Systems  Constitutional:  Negative for fever.  Cardiovascular:  Negative for chest pain and palpitations.  Gastrointestinal:  Negative for constipation, diarrhea, nausea and vomiting.  Neurological:  Negative for dizziness, weakness and headaches.  Psychiatric/Behavioral:  Pt denies extrapyramidal symptoms including dystonia (sudden spastic contractions of muscle groups), parkinsonism (bradykinesia, tremors, rigidity), and akathisia (severe restlessness).     Vital Signs: Blood pressure 100/73, pulse 70, temperature 97.8 F (36.6 C), temperature source Oral, resp. rate 20, height 5' 7 (1.702 m), weight 78.8 kg, SpO2 98%. Body mass index is 27.22 kg/m. Physical Exam Vitals and nursing note reviewed.  HENT:     Head: Normocephalic and atraumatic.  Pulmonary:     Effort: Pulmonary effort is normal.  Neurological:     General: No focal deficit present.     Mental Status: He is alert.  Psychiatric:     Comments: No eps including cogwheel or pain with passive ROM of bilateral upper extremities.      I certify that inpatient services furnished can reasonably be expected to improve the patient's condition.   Signed: Justino Cornish, MD 08/05/2024, 4:43 PM

## 2024-08-05 NOTE — Group Note (Signed)
 Date:  08/05/2024 Time:  8:48 PM  Group Topic/Focus:  Wrap-Up Group:   The focus of this group is to help patients review their daily goal of treatment and discuss progress on daily workbooks.    Participation Level:  Active  Participation Quality:  Appropriate  Affect:  Appropriate  Cognitive:  Appropriate  Insight: Appropriate  Engagement in Group:  Engaged  Modes of Intervention:  Education and Exploration  Additional Comments:  patient attended and participated in group tonight. He reports that today he slept a lot, went for his meals and clean up his room a little.  Gwenn Chillington Dacosta 08/05/2024, 8:48 PM

## 2024-08-06 LAB — VALPROIC ACID LEVEL: Valproic Acid Lvl: 81 ug/mL (ref 50–100)

## 2024-08-06 MED ORDER — HALOPERIDOL DECANOATE 100 MG/ML IM SOLN: 200.0000 mg | INTRAMUSCULAR | 0 refills | Status: AC

## 2024-08-06 NOTE — [place] FL2 (Signed)
 [place]  MEDICAID [id number] LEVEL OF CARE FORM     IDENTIFICATION  Patient Name: Richard Abbott Birthdate: [birth year] Sex: male Admission Date (Current Location): 08/01/2024  [place] and [place] Number: [id number]   Facility and Address:  [facility] [address] [place], [place] [address] Provider Number:    Attending Physician Name and Address:  Raliegh Marsa RAMAN, DO  Relative Name and Phone Number:  Sharene Point (Legal Guardian / [place] [facility]) [phone]    Current Level of Care: Hospital Recommended Level of Care: [facility] Prior Approval Number:    Date Approved/Denied:   PASRR Number:    Discharge Plan:      Current Diagnoses: Patient Active Problem List   Diagnosis Date Noted   Pain due to onychomycosis of toenails of both feet 05/05/2019   Callus of foot 05/05/2019   Constipation 07/30/2018   Internal and external bleeding hemorrhoids 07/30/2018   Morbid obesity (HCC) 02/04/2018   Type 2 diabetes mellitus with other specified complication (HCC) 02/04/2018   Normocytic anemia 01/15/2018   Bronchitis 12/05/2017   Cognitive dysfunction 11/14/2017   Schizophrenia (HCC) 07/29/2017   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    Normal  Continent Weight: 78.8 kg Height:  5' 7 (170.2 cm)  BEHAVIORAL SYMPTOMS/MOOD NEUROLOGICAL BOWEL NUTRITION STATUS  Patient has been behaving well and is polite.  He actively participates in groups.  His thought process is concrete, therefore, when discussing more complex topics, it's important to use simple language and provide repetition to ensure understanding.   No concerns  Continent Patient is well-nourished.  AMBULATORY STATUS COMMUNICATION OF NEEDS Skin   Ambulatory  Verbal Normal                Personal Care Assistance Level of Assistance  Patient is able to walk, eat and shower  independently.    Functional Limitations Info  Patient needs supervision to manage Adult Living Skills, such as housing, shopping, medication management, meal preparation, housekeeping and scheduling appointments.          SPECIAL CARE FACTORS FREQUENCY  N/A     Contractures N/A    Additional Factors Info  N/A    Current Medications (08/06/2024):  This is the current hospital active medication list Current Facility-Administered Medications  Medication Dose Route Frequency Provider Last Rate Last Admin   acetaminophen  (TYLENOL ) tablet 650 mg  650 mg Oral Q6H PRN Smith, Annie B, NP       alum & mag hydroxide-simeth (MAALOX/MYLANTA) 200-200-20 MG/5ML suspension 30 mL  30 mL Oral Q4H PRN Smith, Annie B, NP       atorvastatin  (LIPITOR) tablet 20 mg  20 mg Oral Daily Smith, Annie B, NP   20 mg at 08/05/24 9147   benztropine  (COGENTIN ) tablet 1 mg  1 mg Oral TID Smith, Annie B, NP   1 mg at 08/05/24 1644   cholecalciferol  (VITAMIN D3) 25 MCG (1000 UNIT) tablet 1,000 Units  1,000 Units Oral Daily Smith, Annie B, NP   1,000 Units at 08/05/24 9147   haloperidol (HALDOL) tablet 5 mg  5 mg Oral TID PRN Smith, Annie B, NP       And   diphenhydrAMINE (BENADRYL) capsule 50 mg  50 mg Oral TID PRN Smith, Annie B, NP       haloperidol lactate (HALDOL) injection 5 mg  5 mg Intramuscular TID PRN Claudene,  Annie B, NP       And   diphenhydrAMINE (BENADRYL) injection 50 mg  50 mg Intramuscular TID PRN Smith, Annie B, NP       And   LORazepam (ATIVAN) injection 2 mg  2 mg Intramuscular TID PRN Smith, Annie B, NP       haloperidol lactate (HALDOL) injection 10 mg  10 mg Intramuscular TID PRN Smith, Annie B, NP       And   diphenhydrAMINE (BENADRYL) injection 50 mg  50 mg Intramuscular TID PRN Smith, Annie B, NP       And   LORazepam (ATIVAN) injection 2 mg  2 mg Intramuscular TID PRN Smith, Annie B, NP       divalproex  (DEPAKOTE ) DR tablet 1,000 mg  1,000 mg Oral QHS Smith, Annie B, NP   1,000 mg at  08/05/24 2035   divalproex  (DEPAKOTE ) DR tablet 500 mg  500 mg Oral Daily Ji, Andrew, MD   500 mg at 08/05/24 0852   [START ON 08/23/2024] haloperidol decanoate (HALDOL DECANOATE) 100 MG/ML injection 200 mg  200 mg Intramuscular Q21 days McCarty, Artie, MD       pantoprazole (PROTONIX) EC tablet 80 mg  80 mg Oral Daily Smith, Annie B, NP   80 mg at 08/05/24 0851   polyethylene glycol (MIRALAX / GLYCOLAX) packet 17 g  17 g Oral Daily PRN McCarty, Artie, MD       sertraline  (ZOLOFT ) tablet 100 mg  100 mg Oral Landrum Sharps, Annie B, NP   100 mg at 08/06/24 9379   traZODone (DESYREL) tablet 50 mg  50 mg Oral QHS Smith, Annie B, NP   50 mg at 08/05/24 2035     Discharge Medications: Please see discharge summary for a list of discharge medications.  Relevant Imaging Results:  Relevant Lab Results:   Additional Information Allergies:  Chlorpromazine (reaction:  jitter feeling) Code:  full   Aaleyah Witherow O Kirsten Mckone, LCSWA

## 2024-08-06 NOTE — [facility] Suicide Risk Assessment (Signed)
 [facility] INPATIENT:  Family/Significant Other Suicide Prevention Education  Suicide Prevention Education:  Education Completed; Alm Lied - director of the group home - [phone] ,  (name of family member/significant other) has been identified by the patient as the family member/significant other with whom the patient will be residing, and identified as the person(s) who will aid the patient in the event of a mental health crisis (suicidal ideations/suicide attempt).  With written consent from the patient, the family member/significant other has been provided the following suicide prevention education, prior to the and/or following the discharge of the patient.  Patient doesn't have access to guns or weapons in the group home.  The suicide prevention education provided includes the following: Suicide risk factors Suicide prevention and interventions National Suicide Hotline telephone number [facility] assessment telephone number [facility] Emergency Assistance 911 [facility] and/or Residential Mobile Crisis Unit telephone number  Request made of family/significant other to: Remove weapons (e.g., guns, rifles, knives), all items previously/currently identified as safety concern.   Remove drugs/medications (over-the-counter, prescriptions, illicit drugs), all items previously/currently identified as a safety concern.  The family member/significant other verbalizes understanding of the suicide prevention education information provided.  The family member/significant other agrees to remove the items of safety concern listed above.  Joliene Salvador O Kayton Ripp, LCSWA 08/06/2024, 9:58 AM

## 2024-08-06 NOTE — Progress Notes (Signed)
 Patient was given return to work letter.   [name], LCSWA 08/06/2024

## 2024-08-06 NOTE — Progress Notes (Addendum)
  [facility] Adult Case Management Discharge Plan :  Will you be returning to the same living situation after discharge:  Yes,  patient lives in a group home, [facility] At discharge, do you have transportation home?: Yes,  Alm Lied - director of the group home - [phone] will pick him up at 10:00 AM Do you have the ability to pay for your medications: Yes,  patient has insurance  Release of information consent forms completed and in the chart;  Patient's signature needed at discharge.  Patient to Follow up at:  Follow-up Information     Lovena Cable NP Follow up on 08/07/2024.   Why: Please continue with this provider on 08/07/24 at 9:00 am for medication management services. Contact information: [place], [place] [phone]        [facility] Follow up on 08/07/2024.   Why: Please continue with this provider on 08/07/24 at 9:00 am for the day program & therapy services. Contact information: [address] [place], [place]                Next level of care provider has access to [facility] Link:no  Safety Planning and Suicide Prevention discussed:  Yes, Alm Lied - director of the group home - [phone]   Has patient been referred to the Quitline?: Patient refused referral for treatment  Patient has been referred for addiction treatment: No known substance use disorder.   Teola Felipe O Diquan Kassis, LCSWA 08/06/2024, 9:14 AM

## 2024-08-06 NOTE — Discharge Summary (Signed)
 Physician Discharge Summary Note  Patient:  Richard Abbott is an 58 y.o., male MRN:  [id number] DOB:  20-Jun-1966 Patient phone:  [phone] (home)  Patient address:   [address] [place] [address],  Total Time spent with patient: 20 minutes  Date of Admission:  08/01/2024 Date of Discharge: 08/06/2024  Reason for Admission:   Richard Abbott is a 58 y.o., male with history of schizophrenia living in a group home is admitted to behavioral health hospital involuntarily due to worsening psychosis and making threats to hurt others.  Patient has a legal guardian through [facility] (Latoya Annson).    Patient participates minimally during assessment but was pleasant throughout although will smile inappropriately at times. He reports he became very irritable as of late due to new tenants at the group home. He was unable to go into detail as to the circumstances that resulted in his hospitalization but does state that there was some type of dispute specifically related to one of the new tenants that caused him to feel angrier than he has ever been before.  He reports he is not normally angry but he was name called multiple different things and this for him was very frustrating.  He states that he has been at that group home for years and has not had any problems.  He reports his medications have largely been unchanged for the past few years which is consistent with med dispense history.  He denies any acute side effects or noncompliance in regards to psychotropic medications.  Principal Problem: Schizophrenia [facility]) Discharge Diagnoses: Principal Problem:   Schizophrenia [facility])   Past Psychiatric History:  Previous psych diagnoses: schizophrenia Prior inpatient psychiatric treatment: endorses Prior outpatient psychiatric treatment: endorses Current psychiatric provider: Lenn Filippo, NP Current therapist: denies History of suicide attempts: denies History of homicide: denies     Past Medical History:  Past Medical History:  Diagnosis Date   Anxiety    Bipolar disorder (HCC)    GERD (gastroesophageal reflux disease)    Hypercholesteremia    Schizophrenia (HCC)    Type 2 diabetes mellitus (HCC)     Past Surgical History:  Procedure Laterality Date   BIOPSY  02/11/2018   Procedure: BIOPSY;  Surgeon: Harvey Margo CROME, MD;  [facility];  Service: Endoscopy;;  duodenal biopsy and gastric biopsy   COLONOSCOPY WITH PROPOFOL  N/A 02/11/2018   Procedure: COLONOSCOPY WITH PROPOFOL ;  Surgeon: Harvey Margo CROME, MD;  [facility];  Service: Endoscopy;  Laterality: N/A;  1:00pm   ESOPHAGOGASTRODUODENOSCOPY (EGD) WITH PROPOFOL  N/A 02/11/2018   Procedure: ESOPHAGOGASTRODUODENOSCOPY (EGD) WITH PROPOFOL ;  Surgeon: Harvey Margo CROME, MD;  [facility];  Service: Endoscopy;  Laterality: N/A;   LACERATION REPAIR Right    Arm   POLYPECTOMY  02/11/2018   Procedure: POLYPECTOMY;  Surgeon: Harvey Margo CROME, MD;  [facility];  Service: Endoscopy;;  cecal polyp cb,   Family History:  Family History  Problem Relation Age of Onset   Colon cancer Neg Hx    Colon polyps Neg Hx    Family Psychiatric  History:  None Reported  Social History:  Social History   Substance and Sexual Activity  Alcohol Use No   Comment: Prior history of alcohol abuse     Social History   Substance and Sexual Activity  Drug Use No   Comment: Prior history of drug abuse    Social History   Socioeconomic History   Marital status: Single  Spouse name: Not on file   Number of children: Not on file   Years of education: Not on file   Highest education level: Not on file  Occupational History   Not on file  Tobacco Use   Smoking status: Every Day    Current packs/day: 0.25    Average packs/day: 0.3 packs/day for 20.0 years (5.0 ttl pk-yrs)    Types: Cigarettes, Cigars   Smokeless tobacco: Never   Tobacco comments:    Patient unsure of date where he  began smoking but has been smoking for many years.  Vaping Use   Vaping status: Never Used  Substance and Sexual Activity   Alcohol use: No    Comment: Prior history of alcohol abuse   Drug use: No    Comment: Prior history of drug abuse   Sexual activity: Not Currently    Partners: Male    Comment: Denies that he is currently sexually active  Other Topics Concern   Not on file  Social History Narrative   Grew up in  [place], [place].    Reports that he has a big family.    Has one brother and five sisters.   Father died. Mother living.  Does not have contact with his family.   Wants to move back to [place].      Moved in group home on 07/10/2017 from [facility] for 3 years.    Was in [place] for 3 years after being incarcerated for drugs/alcohol/burgularly.    DSS is Guardian.       Social Drivers of Corporate investment banker Strain: Not on file  Food Insecurity: No Food Insecurity (08/01/2024)   Hunger Vital Sign    Worried About Running Out of Food in the Last Year: Never true    Ran Out of Food in the Last Year: Never true  Transportation Needs: No Transportation Needs (08/01/2024)   PRAPARE - Administrator, Civil Service (Medical): No    Lack of Transportation (Non-Medical): No  Physical Activity: Not on file  Stress: Not on file  Social Connections: Not on file    Hospital Course:   During the patient's hospitalization, patient had extensive initial psychiatric evaluation, and follow-up psychiatric evaluations every day.  Psychiatric diagnoses provided upon initial assessment: Schizophrenia  Patient's psychiatric medications were adjusted on admission: He was started on oral Haldol until his Haldol Dec injection could be confirmed.  He was continued on his home Depakote , Zoloft , and Cogentin  doses.  During the hospitalization, other adjustments were made to the patient's psychiatric medication regimen: His oral Haldol was stopped and he was  given an additional dose of Haldol Dec 100 mg on 10/5 after confirmation of last Haldol Dec injection of 100 mg received on 10/2.  Patient's care was discussed during the interdisciplinary team meeting every day during the hospitalization.  The patient is not having side effects to prescribed psychiatric medication.  Gradually, patient started adjusting to milieu. The patient was evaluated each day by a clinical provider to ascertain response to treatment. Improvement was noted by the patient's report of decreasing symptoms, improved sleep and appetite, affect, medication tolerance, behavior, and participation in unit programming.  Patient was asked each day to complete a self inventory noting mood, mental status, pain, new symptoms, anxiety and concerns.   Symptoms were reported as significantly decreased or resolved completely by discharge.  The patient reports that their mood is stable.  The patient denied having suicidal thoughts for  more than 48 hours prior to discharge.  Patient denies having homicidal thoughts.  Patient denies having auditory hallucinations.  Patient denies any visual hallucinations or other symptoms of psychosis.  The patient was motivated to continue taking medication with a goal of continued improvement in mental health.   The patient reports their target psychiatric symptoms of agitation.irritability responded well to the psychiatric medications, and the patient reports overall benefit other psychiatric hospitalization. Supportive psychotherapy was provided to the patient. The patient also participated in regular group therapy while hospitalized. Coping skills, problem solving as well as relaxation therapies were also part of the unit programming.  Labs were reviewed with the patient, and abnormal results were discussed with the patient.  The patient is able to verbalize their individual safety plan to this provider.  # It is recommended to the patient to continue  psychiatric medications as prescribed, after discharge from the hospital.    # It is recommended to the patient to follow up with your outpatient psychiatric provider and PCP.  # It was discussed with the patient, the impact of alcohol, drugs, tobacco have been there overall psychiatric and medical wellbeing, and total abstinence from substance use was recommended the patient.ed.  # Prescriptions provided or sent directly to preferred pharmacy at discharge. Patient agreeable to plan. Given opportunity to ask questions. Appears to feel comfortable with discharge.    # In the event of worsening symptoms, the patient is instructed to call the crisis hotline, 911 and or go to the nearest ED for appropriate evaluation and treatment of symptoms. To follow-up with primary care provider for other medical issues, concerns and or health care needs  # Patient was discharged to his group home with a plan to follow up as noted below.    On day of discharge he reports feeling good.  He reports no side effects to his medications.  He reports his sleep is good.  He reports his appetite is good.  He reports no SI, HI, or AVH.  Discussed with him the importance of taking his medications as prescribed and attending his follow up appointments and he reported understanding.  Discussed with him what to do in the event of a future crisis.  Discussed that he can go to [facility], go to the nearest ED, or call 911 or 988.   He reported understanding and had no concerns.  He was discharged back to his group home.   Physical Findings: AIMS: Facial and Oral Movements Muscles of Facial Expression: None Lips and Perioral Area: None Jaw: None Tongue: None,Extremity Movements Upper (arms, wrists, hands, fingers): None Lower (legs, knees, ankles, toes): None, Trunk Movements Neck, shoulders, hips: None, Global Judgements Severity of abnormal movements overall : None Incapacitation due to abnormal movements: None Patient's  awareness of abnormal movements: No Awareness,  ,  , AIMS Total Score AIMS Total Score: 0 CIWA:    COWS:     Musculoskeletal: Strength & Muscle Tone: within normal limits Gait & Station: normal Patient leans: N/A   Psychiatric Specialty Exam:  Presentation  General Appearance:  Appropriate for Environment; Casual  Eye Contact: Good  Speech: Clear and Coherent; Normal Rate  Speech Volume: Normal  Handedness: Right   Mood and Affect  Mood: Euthymic  Affect: Congruent; Appropriate   Thought Process  Thought Processes: Coherent; Goal Directed  Descriptions of Associations:Intact  Orientation:Full (Time, Place and Person)  Thought Content:Logical; WDL  History of Schizophrenia/Schizoaffective disorder:Yes  Duration of Psychotic Symptoms:No data recorded Hallucinations:Hallucinations: None  Ideas of Reference:None  Suicidal Thoughts:Suicidal Thoughts: No  Homicidal Thoughts:Homicidal Thoughts: No   Sensorium  Memory: Immediate Fair  Judgment: Fair  Insight: Fair   Executive Functions  Concentration: Good  Attention Span: Good  Recall: Good  Fund of Knowledge: Good  Language: Good   Psychomotor Activity  Psychomotor Activity: Psychomotor Activity: Normal   Assets  Assets: Communication Skills; Physical Health; Resilience   Sleep  Sleep: Sleep: Good  Estimated Sleeping Duration (Last 24 Hours): 7.00-7.50 hours   Physical Exam: Physical Exam Vitals and nursing note reviewed.  Constitutional:      General: He is not in acute distress.    Appearance: Normal appearance. He is normal weight. He is not ill-appearing or toxic-appearing.  HENT:     Head: Normocephalic and atraumatic.  Pulmonary:     Effort: Pulmonary effort is normal.  Musculoskeletal:        General: Normal range of motion.  Neurological:     General: No focal deficit present.     Mental Status: He is alert.    Review of Systems  Respiratory:   Negative for cough and shortness of breath.   Cardiovascular:  Negative for chest pain.  Gastrointestinal:  Negative for abdominal pain, constipation, diarrhea, nausea and vomiting.  Neurological:  Negative for dizziness, weakness and headaches.  Psychiatric/Behavioral:  Negative for depression, hallucinations and suicidal ideas. The patient is not nervous/anxious.    Blood pressure 102/74, pulse 68, temperature 97.7 F (36.5 C), temperature source Oral, resp. rate 20, height 5' 7 (1.702 m), weight 78.8 kg, SpO2 98%. Body mass index is 27.22 kg/m.   Social History   Tobacco Use  Smoking Status Every Day   Current packs/day: 0.25   Average packs/day: 0.3 packs/day for 20.0 years (5.0 ttl pk-yrs)   Types: Cigarettes, Cigars  Smokeless Tobacco Never  Tobacco Comments   Patient unsure of date where he began smoking but has been smoking for many years.   Tobacco Cessation:  A prescription for an FDA-approved tobacco cessation medication was offered at discharge and the patient refused   Blood Alcohol level:  Lab Results  Component Value Date   [facility] <15 07/30/2024   ETH <10 09/26/2022    Metabolic Disorder Labs:  Lab Results  Component Value Date   HGBA1C 5.7 (H) 08/02/2024   MPG 116.89 08/02/2024   MPG 137 06/26/2018   No results found for: PROLACTIN Lab Results  Component Value Date   CHOL 119 08/02/2024   TRIG 102 08/02/2024   HDL 34 (L) 08/02/2024   CHOLHDL 3.5 08/02/2024   VLDL 20 08/02/2024   LDLCALC 65 08/02/2024   LDLCALC 80 12/05/2017    See Psychiatric Specialty Exam and Suicide Risk Assessment completed by Attending Physician prior to discharge.  Discharge destination:  Other:  Group Home  Is patient on multiple antipsychotic therapies at discharge:  No   Has Patient had three or more failed trials of antipsychotic monotherapy by history:  No  Recommended Plan for Multiple Antipsychotic Therapies: NA  Discharge Instructions     Diet - low sodium  heart healthy   Complete by: As directed    Increase activity slowly   Complete by: As directed       Allergies as of 08/06/2024       Reactions   Chlorpromazine Other (See Comments)   Jitter feeling        Medication List     TAKE these medications      Indication  atorvastatin  20 MG tablet Commonly known as: LIPITOR Take 1 tablet (20 mg total) by mouth daily.  Indication: High Amount of Fats in the Blood   benztropine  1 MG tablet Commonly known as: COGENTIN  Take 1 mg by mouth 3 (three) times daily.  Indication: Extrapyramidal Reaction caused by Medications   cholecalciferol  25 MCG (1000 UNIT) tablet Commonly known as: VITAMIN D3 Take 1,000 Units by mouth daily.  Indication: Vitamin D  Deficiency   divalproex  500 MG DR tablet Commonly known as: DEPAKOTE  Take 500 mg by mouth every morning.  Indication: Schizophrenia   divalproex  500 MG DR tablet Commonly known as: DEPAKOTE  Take 1,000 mg by mouth at bedtime.  Indication: Schizophrenia   haloperidol decanoate 100 MG/ML injection Commonly known as: HALDOL DECANOATE Inject 2 mLs (200 mg total) into the muscle every 21 ( twenty-one) days. Start taking on: August 23, 2024 What changed:  how much to take when to take this  Indication: Schizophrenia   omeprazole  20 MG capsule Commonly known as: PRILOSEC TAKE 1 CAPSULE BY MOUTH EACH MORNING AT 6:30AM What changed: See the new instructions.  Indication: Heartburn   sertraline  100 MG tablet Commonly known as: ZOLOFT  Take 100 mg by mouth every morning.  Indication: Major Depressive Disorder   traZODone 50 MG tablet Commonly known as: DESYREL Take 50 mg by mouth at bedtime.  Indication: Schizophrenia with Depression        Follow-up Information     Lovena Cable NP Follow up on 08/07/2024.   Why: Please continue with this provider on 08/07/24 at 9:00 am for medication management services. Contact information: [place], [place] [phone]         [facility] Follow up on 08/07/2024.   Why: Please continue with this provider on 08/07/24 at 9:00 am for the day program & therapy services. Contact information: [address], [place], [place]                Follow-up recommendations/Comments:   Activity: as tolerated   Diet: heart healthy   Other: -Follow-up with your outpatient psychiatric provider -instructions on appointment date, time, and address (location) are provided to you in discharge paperwork.   -Take your psychiatric medications as prescribed at discharge - instructions are provided to you in the discharge paperwork   -Follow-up with outpatient primary care doctor and other specialists -for management of chronic medical disease, including: Routine Care   -Testing: Follow-up with outpatient provider for abnormal lab results: Elevated A1c, Low HDL   -Recommend abstinence from alcohol, tobacco, and other illicit drug use at discharge.    -If your psychiatric symptoms recur, worsen, or if you have side effects to your psychiatric medications, call your outpatient psychiatric provider, 911, [phone] or go to the nearest emergency department.   -If suicidal thoughts recur, call your outpatient psychiatric provider, 911, 988 or go to the nearest emergency department.  Signed: Marsa GORMAN Rosser, DO 08/06/2024, 2:24 PM

## 2024-08-06 NOTE — Progress Notes (Addendum)
 Collateral contact - Latoya Annson (Legal Guardian) [phone], [email or web address] >   CSW left a voicemail for the Legal Guardian, confirming that patient will be discharged today, around 9:30 AM - 10 AM.  CSW emailed the legal guardian the ROI and request & authorization for use / disclosure asking to sign it.     Masami Plata, LCSWA 08/06/2024

## 2024-08-06 NOTE — Progress Notes (Signed)
 Patient discharged off unit at 1000am. Patient belongings reviewed and acknowledged by patient. AVS and Transition Record reviewed and acknowledged by patient. Safety plan completed by patient, reviewed by nurse with patient and copy provided. Any medications and or prescriptions necessary for discharge addressed and provided to patient. Patient denies SI, plan or intent. Denies HI. Denies AVH. No observed or reported side effects to medication. No observed or reported agitation, aggression, or other acute emotional distress. No reported or observed physical abnormalities or concerns. Patient transportation from facility verified and observed. Patients guardian notified of discharge by Child psychotherapist.

## 2024-08-06 NOTE — [facility] Suicide Risk Assessment (Signed)
 [facility] Discharge Suicide Risk Assessment   Principal Problem: Schizophrenia [facility]) Discharge Diagnoses: Principal Problem:   Schizophrenia (HCC)   During the patient's hospitalization, patient had extensive initial psychiatric evaluation, and follow-up psychiatric evaluations every day.  Psychiatric diagnoses provided upon initial assessment: Schizophrenia   Patient's psychiatric medications were adjusted on admission: He was started on oral Haldol until his Haldol Dec injection could be confirmed. He was continued on his home Depakote , Zoloft , and Cogentin  doses.   During the hospitalization, other adjustments were made to the patient's psychiatric medication regimen: His oral Haldol was stopped and he was given an additional dose of Haldol Dec 100 mg on 10/5 after confirmation of last Haldol Dec injection of 100 mg received on 10/2.   Gradually, patient started adjusting to milieu.   Patient's care was discussed during the interdisciplinary team meeting every day during the hospitalization.  The patient is not having side effects to prescribed psychiatric medication.  The patient reports their target psychiatric symptoms of agitation/irritability  responded well to the psychiatric medications, and the patient reports overall benefit other psychiatric hospitalization. Supportive psychotherapy was provided to the patient. The patient also participated in regular group therapy while admitted.   Labs were reviewed with the patient, and abnormal results were discussed with the patient.  The patient denied having suicidal thoughts more than 48 hours prior to discharge.  Patient denies having homicidal thoughts.  Patient denies having auditory hallucinations.  Patient denies any visual hallucinations.  Patient denies having paranoid thoughts.  The patient is able to verbalize their individual safety plan to this provider.  It is recommended to the patient to continue psychiatric medications as  prescribed, after discharge from the hospital.    It is recommended to the patient to follow up with your outpatient psychiatric provider and PCP.  Discussed with the patient, the impact of alcohol, drugs, tobacco have been there overall psychiatric and medical wellbeing, and total abstinence from substance use was recommended the patient.   Total Time spent with patient: 20 minutes  Musculoskeletal: Strength & Muscle Tone: within normal limits Gait & Station: normal Patient leans: N/A  Psychiatric Specialty Exam  Presentation  General Appearance:  Appropriate for Environment; Casual  Eye Contact: Good  Speech: Clear and Coherent; Normal Rate  Speech Volume: Normal  Handedness: Right   Mood and Affect  Mood: Euthymic  Duration of Depression Symptoms: N/A  Affect: Congruent; Appropriate   Thought Process  Thought Processes: Coherent; Goal Directed  Descriptions of Associations:Intact  Orientation:Full (Time, Place and Person)  Thought Content:Logical; WDL  History of Schizophrenia/Schizoaffective disorder:Yes  Duration of Psychotic Symptoms:No data recorded Hallucinations:Hallucinations: None  Ideas of Reference:None  Suicidal Thoughts:Suicidal Thoughts: No  Homicidal Thoughts:Homicidal Thoughts: No   Sensorium  Memory: Immediate Fair  Judgment: Fair  Insight: Fair   Executive Functions  Concentration: Good  Attention Span: Good  Recall: Good  Fund of Knowledge: Good  Language: Good   Psychomotor Activity  Psychomotor Activity: Psychomotor Activity: Normal   Assets  Assets: Communication Skills; Physical Health; Resilience   Sleep  Sleep: Sleep: Good  Estimated Sleeping Duration (Last 24 Hours): 7.00-7.50 hours  Physical Exam: Physical Exam Vitals and nursing note reviewed.  Constitutional:      General: He is not in acute distress.    Appearance: Normal appearance. He is normal weight. He is not  ill-appearing or toxic-appearing.  HENT:     Head: Normocephalic and atraumatic.  Pulmonary:     Effort: Pulmonary effort is normal.  Musculoskeletal:        General: Normal range of motion.  Neurological:     General: No focal deficit present.     Mental Status: He is alert.    Review of Systems  Respiratory:  Negative for cough and shortness of breath.   Cardiovascular:  Negative for chest pain.  Gastrointestinal:  Negative for abdominal pain, constipation, diarrhea, nausea and vomiting.  Neurological:  Negative for dizziness, weakness and headaches.  Psychiatric/Behavioral:  Negative for depression, hallucinations and suicidal ideas. The patient is not nervous/anxious.    Blood pressure 102/74, pulse 68, temperature 97.7 F (36.5 C), temperature source Oral, resp. rate 20, height 5' 7 (1.702 m), weight 78.8 kg, SpO2 98%. Body mass index is 27.22 kg/m.  Mental Status Per Nursing Assessment::   On Admission:  NA  Demographic Factors:  Male and Low socioeconomic status  Loss Factors: NA  Historical Factors: Prior suicide attempts  Risk Reduction Factors:   Living with another person, especially a relative, Positive therapeutic relationship, and Positive coping skills or problem solving skills  Continued Clinical Symptoms:  Previous Psychiatric Diagnoses and Treatments  Cognitive Features That Contribute To Risk:  Loss of executive function    Suicide Risk:  Minimal: No identifiable suicidal ideation.  Patients presenting with no risk factors but with morbid ruminations; may be classified as minimal risk based on the severity of the depressive symptoms   Follow-up Information     [facility] Go on 08/17/2024.   Why: You have an appointment for medication management services on 08/17/24 at 11:00 am .  The appointment will be held in person, but you may call to switch to Virtual.  * Intake forms will be emailed to you.  Please call to confirm your appt. Contact  information: [address] [place] [address] [phone]         [facility] Follow up on 08/05/2024.   Specialty: Behavioral Health Why: Please call this provider on 08/05/24 at 9:00 am to personally schedule an appointment for therapy services. Contact information: [address] [place]  [address] [phone]        [facility] Follow up on 08/05/2024.   Why: You may also call this provider on 08/05/24 at 9:00 am to personally schedule an appointment for therapy services. Contact information: [address] [place] [place], [place] [address] [phone]                Plan Of Care/Follow-up recommendations:  Activity: as tolerated  Diet: heart healthy  Other: -Follow-up with your outpatient psychiatric provider -instructions on appointment date, time, and address (location) are provided to you in discharge paperwork.  -Take your psychiatric medications as prescribed at discharge - instructions are provided to you in the discharge paperwork  -Follow-up with outpatient primary care doctor and other specialists -for management of chronic medical disease, including: Routine Care  -Testing: Follow-up with outpatient provider for abnormal lab results: Elevated A1c, Low HDL  -Recommend abstinence from alcohol, tobacco, and other illicit drug use at discharge.   -If your psychiatric symptoms recur, worsen, or if you have side effects to your psychiatric medications, call your outpatient psychiatric provider, 911, [phone] or go to the nearest emergency department.  -If suicidal thoughts recur, call your outpatient psychiatric provider, 911, [phone] or go to the nearest emergency department.   Marsa GORMAN Rosser, DO 08/06/2024, 8:47 AM

## 2024-08-06 NOTE — Plan of Care (Signed)

## 2024-08-14 ENCOUNTER — Ambulatory Visit: Admitting: Podiatry

## 2024-08-14 DIAGNOSIS — Z538 Procedure and treatment not carried out for other reasons: Secondary | ICD-10-CM

## 2024-08-14 NOTE — Progress Notes (Signed)
 1. Appointment canceled by hospital    Patient hospitalized.

## 2024-11-04 ENCOUNTER — Ambulatory Visit: Admitting: Podiatry

## 2024-11-04 VITALS — Ht 67.0 in | Wt 173.0 lb

## 2024-11-04 DIAGNOSIS — B351 Tinea unguium: Secondary | ICD-10-CM | POA: Diagnosis not present

## 2024-11-04 DIAGNOSIS — M79675 Pain in left toe(s): Secondary | ICD-10-CM | POA: Diagnosis not present

## 2024-11-04 DIAGNOSIS — D239 Other benign neoplasm of skin, unspecified: Secondary | ICD-10-CM

## 2024-11-04 DIAGNOSIS — D2372 Other benign neoplasm of skin of left lower limb, including hip: Secondary | ICD-10-CM | POA: Diagnosis not present

## 2024-11-04 DIAGNOSIS — M79674 Pain in right toe(s): Secondary | ICD-10-CM

## 2024-11-04 NOTE — Progress Notes (Signed)
"  °  Subjective:  Patient ID: Richard Abbott, male    DOB: Jul 19, 1966,  MRN: [id number]  Chief Complaint  Patient presents with   Nail Problem    RM 1 Patient is here for nail trim and bilateral calluses of the halluces.    59 y.o. male presents with the above complaint. History confirmed with patient.  He is here from a group home.  He states he is not diabetic but his chart indicates otherwise.  He has skin growths on both feet that are very painful for him.  His nails are also thickened and he is unable to cut them.  They cause pain in shoe gear.  Objective:  Physical Exam: warm, good capillary refill, no trophic changes or ulcerative lesions, normal DP and PT pulses, and normal sensory exam. Left Foot: dystrophic yellowed discolored nail plates with subungual debris and benign hyperkeratotic lesion appears to be consistent with a cutaneous horn left great toe medial and first MTP plantar Right Foot: dystrophic yellowed discolored nail plates with subungual debris and cutaneous horn medial hallux  Assessment:   1. Benign neoplasm of skin, unspecified location   2. Pain due to onychomycosis of toenails of both feet      Plan:  Patient was evaluated and treated and all questions answered.  Discussed the etiology and treatment options for the condition in detail with the patient. Recommended debridement of the nails today. Sharp and mechanical debridement performed of all painful and mycotic nails today. Nails debrided in length and thickness using a nail nipper to level of comfort. Follow up as needed for painful nails.   Discussed treatment of the skin lesions recommended debridement and chemical destruction of the lesions.  Lesions were sharply debrided and destroyed utilizing a sharp scalpel blade to remove hyperkeratotic tissue.  The central cores of the lesions were exposed and salinocaine salicylic acid treatment were applied.  Advised to leave on for 24 hours.  Follow-up as needed  for this Return if symptoms worsen or fail to improve.   "

## 2025-01-04 ENCOUNTER — Ambulatory Visit: Admitting: Podiatry

## 2025-02-05 ENCOUNTER — Ambulatory Visit: Admitting: Podiatry
